# Patient Record
Sex: Female | Born: 1955 | Race: Black or African American | Hispanic: No | Marital: Married | Smoking: Former smoker
Health system: Southern US, Community
[De-identification: ages and names within clinical notes are randomized; demographics above are authoritative.]

## PROBLEM LIST (undated history)

## (undated) DIAGNOSIS — Z8489 Family history of other specified conditions: Secondary | ICD-10-CM

## (undated) DIAGNOSIS — N760 Acute vaginitis: Secondary | ICD-10-CM

## (undated) DIAGNOSIS — N926 Irregular menstruation, unspecified: Secondary | ICD-10-CM

## (undated) DIAGNOSIS — Z8719 Personal history of other diseases of the digestive system: Secondary | ICD-10-CM

## (undated) DIAGNOSIS — B9689 Other specified bacterial agents as the cause of diseases classified elsewhere: Secondary | ICD-10-CM

## (undated) DIAGNOSIS — N871 Moderate cervical dysplasia: Secondary | ICD-10-CM

## (undated) DIAGNOSIS — G4730 Sleep apnea, unspecified: Secondary | ICD-10-CM

## (undated) DIAGNOSIS — N951 Menopausal and female climacteric states: Secondary | ICD-10-CM

## (undated) DIAGNOSIS — Z87442 Personal history of urinary calculi: Secondary | ICD-10-CM

## (undated) DIAGNOSIS — Z8639 Personal history of other endocrine, nutritional and metabolic disease: Secondary | ICD-10-CM

## (undated) DIAGNOSIS — A599 Trichomoniasis, unspecified: Secondary | ICD-10-CM

## (undated) DIAGNOSIS — N750 Cyst of Bartholin's gland: Secondary | ICD-10-CM

## (undated) DIAGNOSIS — I10 Essential (primary) hypertension: Secondary | ICD-10-CM

## (undated) DIAGNOSIS — D219 Benign neoplasm of connective and other soft tissue, unspecified: Secondary | ICD-10-CM

## (undated) HISTORY — DX: Moderate cervical dysplasia: N87.1

## (undated) HISTORY — DX: Other specified bacterial agents as the cause of diseases classified elsewhere: B96.89

## (undated) HISTORY — DX: Cyst of Bartholin's gland: N75.0

## (undated) HISTORY — DX: Personal history of other diseases of the digestive system: Z87.19

## (undated) HISTORY — DX: Trichomoniasis, unspecified: A59.9

## (undated) HISTORY — DX: Personal history of other endocrine, nutritional and metabolic disease: Z86.39

## (undated) HISTORY — DX: Menopausal and female climacteric states: N95.1

## (undated) HISTORY — PX: OTHER SURGICAL HISTORY: SHX169

## (undated) HISTORY — DX: Benign neoplasm of connective and other soft tissue, unspecified: D21.9

## (undated) HISTORY — DX: Irregular menstruation, unspecified: N92.6

## (undated) HISTORY — DX: Acute vaginitis: N76.0

---

## 1991-01-29 HISTORY — PX: OTHER SURGICAL HISTORY: SHX169

## 1997-01-24 DIAGNOSIS — N871 Moderate cervical dysplasia: Secondary | ICD-10-CM

## 1997-01-24 DIAGNOSIS — A599 Trichomoniasis, unspecified: Secondary | ICD-10-CM

## 1997-01-24 HISTORY — DX: Trichomoniasis, unspecified: A59.9

## 1997-01-24 HISTORY — DX: Moderate cervical dysplasia: N87.1

## 1998-01-09 ENCOUNTER — Other Ambulatory Visit: Admission: RE | Admit: 1998-01-09 | Discharge: 1998-01-09 | Payer: Self-pay | Admitting: Obstetrics and Gynecology

## 1999-06-12 ENCOUNTER — Other Ambulatory Visit: Admission: RE | Admit: 1999-06-12 | Discharge: 1999-06-12 | Payer: Self-pay | Admitting: Obstetrics and Gynecology

## 2000-03-14 ENCOUNTER — Encounter: Payer: Self-pay | Admitting: Family Medicine

## 2000-03-14 ENCOUNTER — Encounter: Admission: RE | Admit: 2000-03-14 | Discharge: 2000-03-14 | Payer: Self-pay | Admitting: Family Medicine

## 2000-08-25 ENCOUNTER — Other Ambulatory Visit: Admission: RE | Admit: 2000-08-25 | Discharge: 2000-08-25 | Payer: Self-pay | Admitting: Obstetrics and Gynecology

## 2000-08-26 ENCOUNTER — Other Ambulatory Visit: Admission: RE | Admit: 2000-08-26 | Discharge: 2000-08-26 | Payer: Self-pay | Admitting: Obstetrics and Gynecology

## 2000-08-26 ENCOUNTER — Encounter (INDEPENDENT_AMBULATORY_CARE_PROVIDER_SITE_OTHER): Payer: Self-pay | Admitting: Specialist

## 2001-04-28 ENCOUNTER — Encounter: Payer: Self-pay | Admitting: Family Medicine

## 2001-04-28 ENCOUNTER — Encounter: Admission: RE | Admit: 2001-04-28 | Discharge: 2001-04-28 | Payer: Self-pay | Admitting: Family Medicine

## 2001-08-27 ENCOUNTER — Other Ambulatory Visit: Admission: RE | Admit: 2001-08-27 | Discharge: 2001-08-27 | Payer: Self-pay | Admitting: Obstetrics and Gynecology

## 2001-08-27 DIAGNOSIS — B9689 Other specified bacterial agents as the cause of diseases classified elsewhere: Secondary | ICD-10-CM

## 2001-08-27 HISTORY — DX: Other specified bacterial agents as the cause of diseases classified elsewhere: B96.89

## 2001-09-02 ENCOUNTER — Encounter: Payer: Self-pay | Admitting: Obstetrics and Gynecology

## 2001-09-02 ENCOUNTER — Encounter: Admission: RE | Admit: 2001-09-02 | Discharge: 2001-09-02 | Payer: Self-pay | Admitting: Obstetrics and Gynecology

## 2002-06-14 ENCOUNTER — Encounter: Admission: RE | Admit: 2002-06-14 | Discharge: 2002-06-14 | Payer: Self-pay | Admitting: Obstetrics and Gynecology

## 2002-06-14 ENCOUNTER — Encounter: Payer: Self-pay | Admitting: Obstetrics and Gynecology

## 2002-06-18 ENCOUNTER — Encounter: Payer: Self-pay | Admitting: Obstetrics and Gynecology

## 2002-06-18 ENCOUNTER — Encounter: Admission: RE | Admit: 2002-06-18 | Discharge: 2002-06-18 | Payer: Self-pay | Admitting: Obstetrics and Gynecology

## 2002-08-24 ENCOUNTER — Other Ambulatory Visit: Admission: RE | Admit: 2002-08-24 | Discharge: 2002-08-24 | Payer: Self-pay | Admitting: Obstetrics and Gynecology

## 2003-09-06 ENCOUNTER — Other Ambulatory Visit: Admission: RE | Admit: 2003-09-06 | Discharge: 2003-09-06 | Payer: Self-pay | Admitting: Obstetrics and Gynecology

## 2003-09-20 ENCOUNTER — Encounter: Admission: RE | Admit: 2003-09-20 | Discharge: 2003-09-20 | Payer: Self-pay | Admitting: Family Medicine

## 2004-10-01 ENCOUNTER — Other Ambulatory Visit: Admission: RE | Admit: 2004-10-01 | Discharge: 2004-10-01 | Payer: Self-pay | Admitting: Obstetrics and Gynecology

## 2004-11-15 ENCOUNTER — Encounter: Admission: RE | Admit: 2004-11-15 | Discharge: 2004-11-15 | Payer: Self-pay | Admitting: Family Medicine

## 2005-10-09 ENCOUNTER — Other Ambulatory Visit: Admission: RE | Admit: 2005-10-09 | Discharge: 2005-10-09 | Payer: Self-pay | Admitting: Obstetrics and Gynecology

## 2005-12-05 ENCOUNTER — Encounter: Admission: RE | Admit: 2005-12-05 | Discharge: 2005-12-05 | Payer: Self-pay | Admitting: Family Medicine

## 2006-12-18 ENCOUNTER — Encounter: Admission: RE | Admit: 2006-12-18 | Discharge: 2006-12-18 | Payer: Self-pay | Admitting: Family Medicine

## 2007-07-30 ENCOUNTER — Encounter: Admission: RE | Admit: 2007-07-30 | Discharge: 2007-07-30 | Payer: Self-pay | Admitting: Family Medicine

## 2008-01-13 ENCOUNTER — Encounter: Admission: RE | Admit: 2008-01-13 | Discharge: 2008-01-13 | Payer: Self-pay | Admitting: Family Medicine

## 2009-01-24 ENCOUNTER — Encounter: Admission: RE | Admit: 2009-01-24 | Discharge: 2009-01-24 | Payer: Self-pay | Admitting: Family Medicine

## 2009-10-23 ENCOUNTER — Encounter: Admission: RE | Admit: 2009-10-23 | Discharge: 2009-10-23 | Payer: Self-pay | Admitting: Family Medicine

## 2010-01-19 DIAGNOSIS — Z8639 Personal history of other endocrine, nutritional and metabolic disease: Secondary | ICD-10-CM

## 2010-01-19 HISTORY — DX: Personal history of other endocrine, nutritional and metabolic disease: Z86.39

## 2010-01-30 ENCOUNTER — Encounter: Admission: RE | Admit: 2010-01-30 | Discharge: 2010-01-30 | Payer: Self-pay | Admitting: Family Medicine

## 2010-01-30 IMAGING — MG MM DIGITAL SCREENING
4 series · 4 of 4 positions shown · non-contrast
Comparison: Prior studies.

DG SCREEN MAMMOGRAM BILATERAL
Bilateral CC and MLO view(s) were taken.

DIGITAL SCREENING MAMMOGRAM WITH CAD:

[R CC]
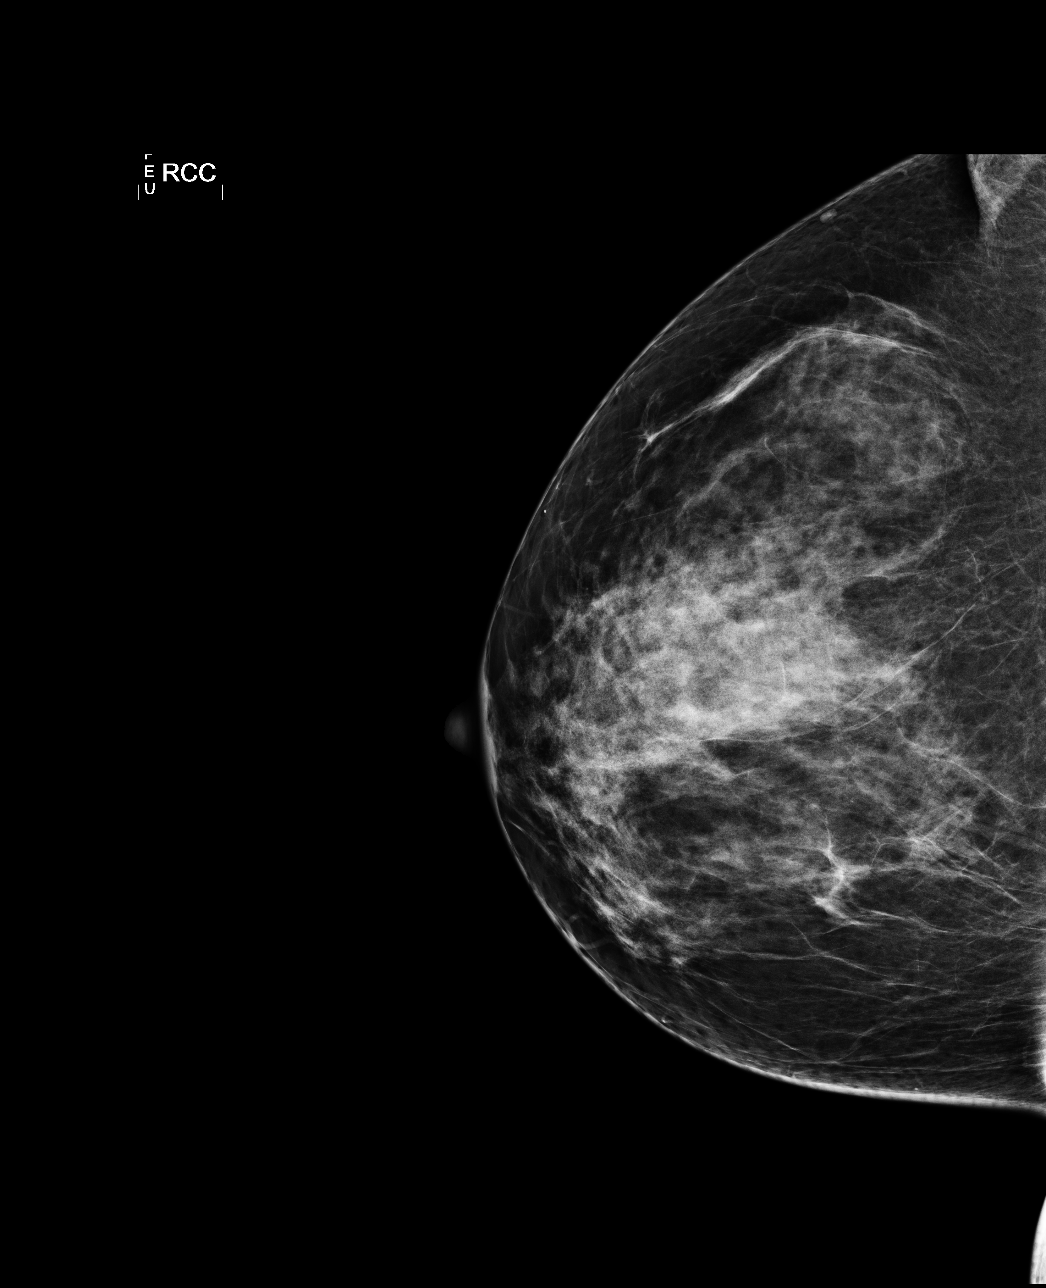

[L CC]
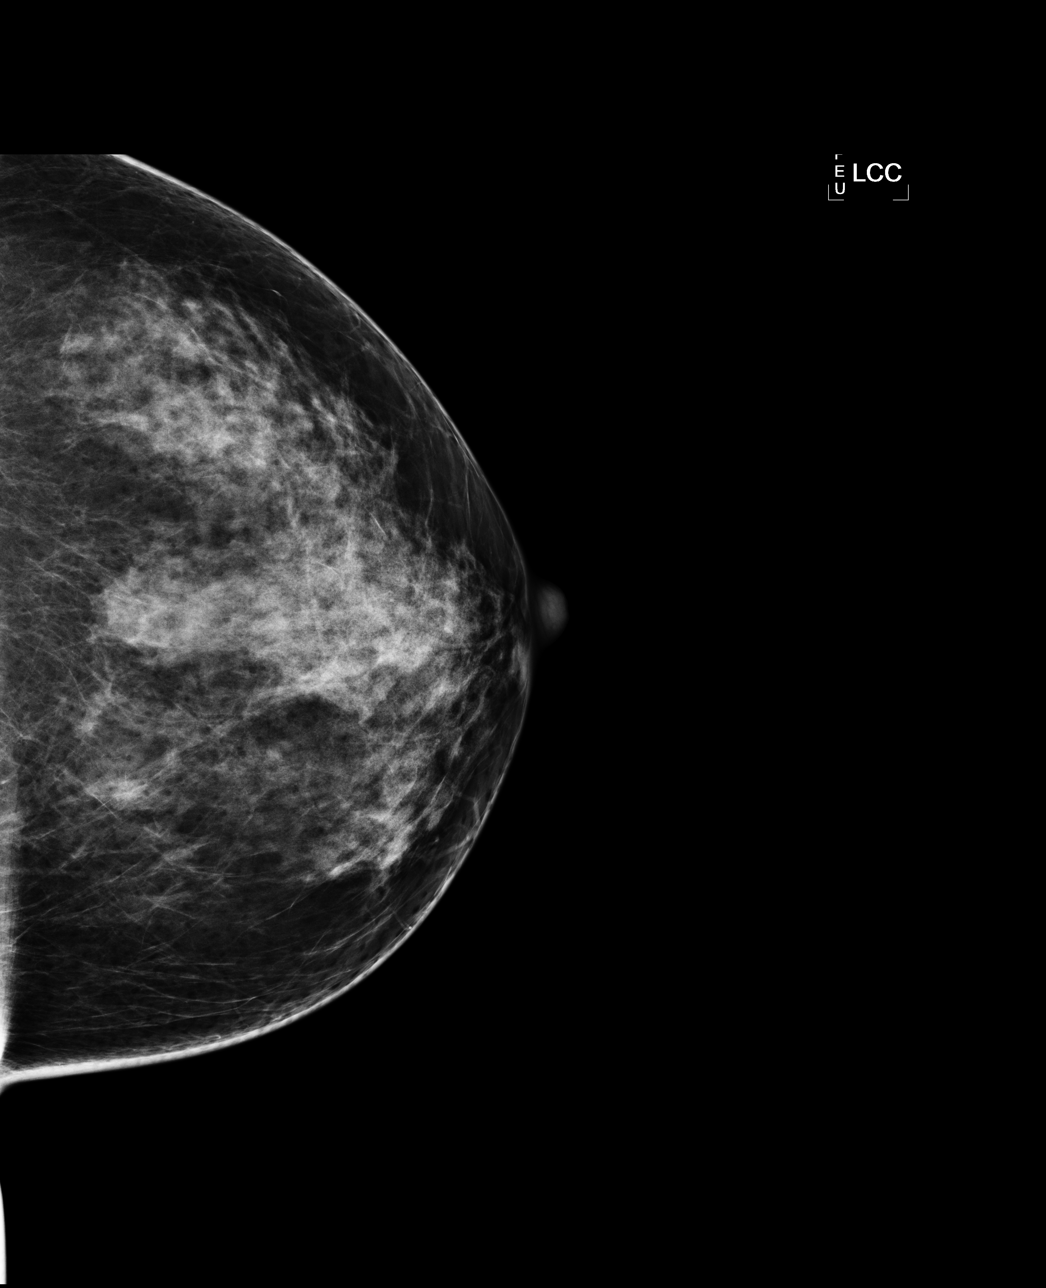

[L MLO]
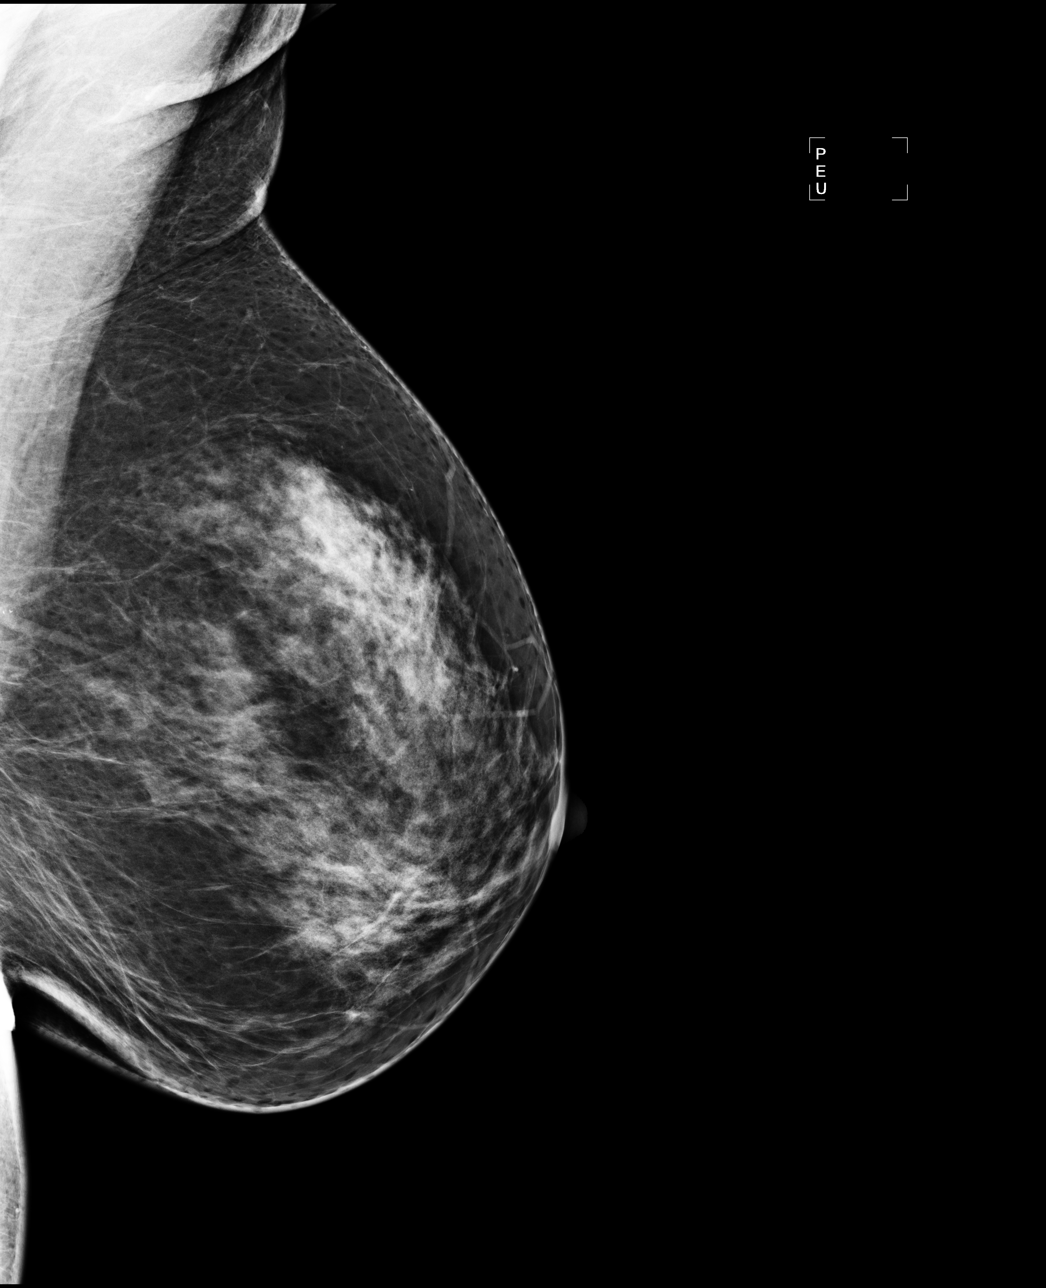

[R MLO]
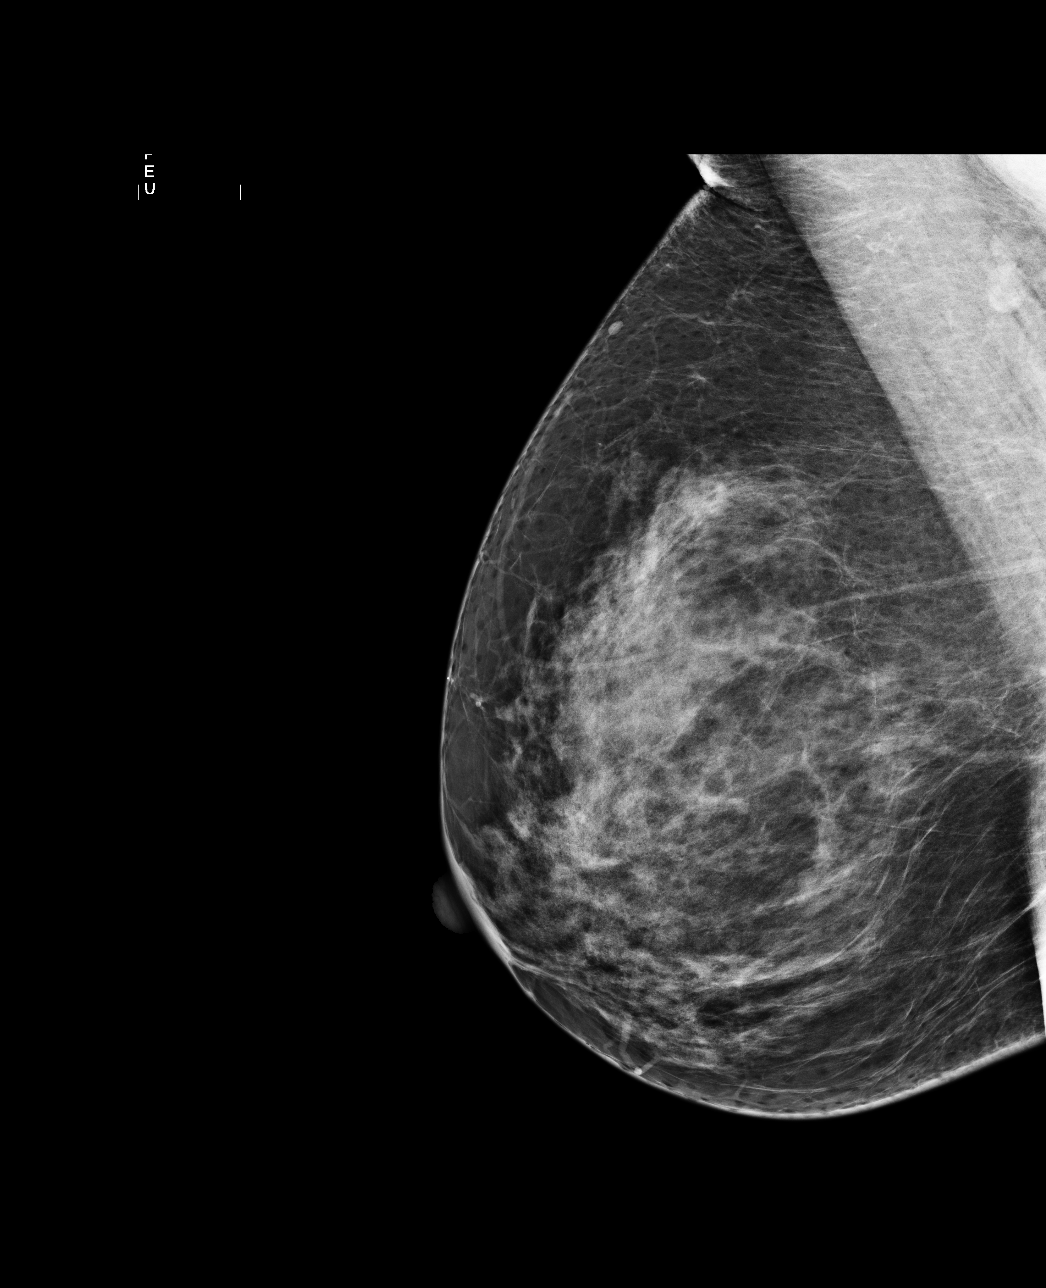

[4 of 4 positions shown; findings below may reference images not displayed]

The breast tissue is heterogeneously dense.  There is no dominant mass, architectural distortion or
calcification to suggest malignancy.

Images were processed with CAD.
IMPRESSION: No mammographic evidence of malignancy.  Suggest yearly screening mammography.

A result letter of this screening mammogram will be mailed directly to the patient.

ASSESSMENT: Negative - BI-RADS 1

Screening mammogram in 1 year.
,

## 2011-01-24 ENCOUNTER — Other Ambulatory Visit: Payer: Self-pay | Admitting: Family Medicine

## 2011-01-24 DIAGNOSIS — Z1231 Encounter for screening mammogram for malignant neoplasm of breast: Secondary | ICD-10-CM

## 2011-02-01 ENCOUNTER — Ambulatory Visit
Admission: RE | Admit: 2011-02-01 | Discharge: 2011-02-01 | Disposition: A | Discharge status: Home / Self Care | Payer: BC Managed Care – PPO | Source: Ambulatory Visit | Attending: Family Medicine | Admitting: Family Medicine

## 2011-02-01 DIAGNOSIS — Z1231 Encounter for screening mammogram for malignant neoplasm of breast: Secondary | ICD-10-CM

## 2012-01-15 ENCOUNTER — Other Ambulatory Visit: Payer: Self-pay | Admitting: Family Medicine

## 2012-01-15 DIAGNOSIS — Z1231 Encounter for screening mammogram for malignant neoplasm of breast: Secondary | ICD-10-CM

## 2012-01-28 ENCOUNTER — Ambulatory Visit (INDEPENDENT_AMBULATORY_CARE_PROVIDER_SITE_OTHER): Payer: BC Managed Care – PPO | Admitting: Obstetrics and Gynecology

## 2012-01-28 ENCOUNTER — Encounter: Payer: Self-pay | Admitting: Obstetrics and Gynecology

## 2012-01-28 VITALS — BP 118/68 | Temp 98.2°F | Resp 16 | Ht 64.0 in | Wt 173.0 lb

## 2012-01-28 DIAGNOSIS — Z01419 Encounter for gynecological examination (general) (routine) without abnormal findings: Secondary | ICD-10-CM

## 2012-01-28 DIAGNOSIS — K649 Unspecified hemorrhoids: Secondary | ICD-10-CM

## 2012-01-28 DIAGNOSIS — D219 Benign neoplasm of connective and other soft tissue, unspecified: Secondary | ICD-10-CM | POA: Insufficient documentation

## 2012-01-28 DIAGNOSIS — Z124 Encounter for screening for malignant neoplasm of cervix: Secondary | ICD-10-CM

## 2012-01-28 DIAGNOSIS — E7800 Pure hypercholesterolemia, unspecified: Secondary | ICD-10-CM

## 2012-01-28 DIAGNOSIS — D259 Leiomyoma of uterus, unspecified: Secondary | ICD-10-CM

## 2012-01-28 DIAGNOSIS — E669 Obesity, unspecified: Secondary | ICD-10-CM

## 2012-01-28 NOTE — Progress Notes (Signed)
Regular Periods: no Mammogram: 01/2011 per pt Due Monday   Monthly Breast Ex.: yes Exercise: yes  Tetanus < 10 years: yes Seatbelts: yes  NI. Bladder Functn.: yes Abuse at home: no  Daily BM's: yes Stressful Work: no  Healthy Diet: yes Sigmoid-Colonoscopy: per pt 2008  Calcium: yes Medical problems this year: per pt none.   LAST PAP: 01/2009 "WNL"  Contraception: per pt none  Mammogram:  Due Monday 02-03-2012  PCP: Dr.Donna Kevan Ny  PMH: No Changes  FMH: No Changes  Last Bone Scan: per pt  "2010" "WNL"   Subjective:    Heather Wilkerson is a 56 y.o. female No obstetric history on file. who presents for annual exam. The patient has no complaints today. She has a known history of hemorrhoids but is doing well.  She has occasional bleeding only.  The patient has known fibroids but this is not a problem for her.  The following portions of the patient's history were reviewed and updated as appropriate: allergies, current medications, past family history, past medical history, past social history, past surgical history and problem list. See above.  Review of Systems Pertinent items are noted in HPI. Gastrointestinal:No change in bowel habits, no abdominal pain, no rectal bleeding Genitourinary:negative for dysuria, frequency, hematuria, nocturia and urinary incontinence    Objective:     BP 118/68  Temp 98.2 F (36.8 C)  Resp 16  Ht 5\' 4"  (1.626 m)  Wt 173 lb (78.472 kg)  BMI 29.70 kg/m2  Weight:  Wt Readings from Last 1 Encounters:  01/28/12 173 lb (78.472 kg)     BMI: Body mass index is 29.70 kg/(m^2). General Appearance: Alert, appropriate appearance for age. No acute distress HEENT: Grossly normal Neck / Thyroid: Supple, no masses, nodes or enlargement Lungs: clear to auscultation bilaterally Back: No CVA tenderness Breast Exam: No masses or nodes.No dimpling, nipple retraction or discharge. Cardiovascular: Regular rate and rhythm. S1, S2, no murmur Gastrointestinal:  Soft, non-tender, no masses or organomegaly  ++++++++++++++++++++++++++++++++++++++++++++++++++++++++  Pelvic Exam: External genitalia: normal general appearance Vaginal: normal without tenderness, induration or masses and atrophic mucosa Cervix: normal appearance Adnexa: normal bimanual exam Uterus: upper limits normal size, irregular Rectovaginal: normal rectal, no masses and hemorrhoids: external non-thrombosed  ++++++++++++++++++++++++++++++++++++++++++++++++++++++++  Lymphatic Exam: Non-palpable nodes in neck, clavicular, axillary, or inguinal regions  Psychiatric: Alert and oriented, appropriate affect.    Urinalysis:Not done      Assessment:    Normal gyn exam   Fibroid uterus  Hemorrhoids  Hypercholesterolemia  Atrophic vaginal changes  Overweight or obese: Yes  Pelvic relaxation: No  Menopausal symptoms: No. Severe: No.   Plan:    Mammogram. Pap smear.   Follow-up:  for annual exam  STD screen request: none,  RPR: No,  HBsAg: No.  Hepatitis C: No.  The updated Pap smear screening guidelines were discussed with the patient. The patient requested that I obtain a Pap smear: Yes.  Kegel exercises discussed: No.  Proper diet and regular exercise were reviewed.  Annual mammograms recommended starting at age 86. Proper breast care was discussed.  Screening colonoscopy is recommended beginning at age 106.  Regular health maintenance was reviewed.  Sleep hygiene was discussed.  Adequate calcium and vitamin D intake was emphasized.  Mylinda Latina.D.

## 2012-01-29 LAB — PAP IG W/ RFLX HPV ASCU

## 2012-02-03 ENCOUNTER — Ambulatory Visit
Admission: RE | Admit: 2012-02-03 | Discharge: 2012-02-03 | Disposition: A | Discharge status: Home / Self Care | Payer: BC Managed Care – PPO | Source: Ambulatory Visit | Attending: Family Medicine | Admitting: Family Medicine

## 2012-02-03 DIAGNOSIS — Z1231 Encounter for screening mammogram for malignant neoplasm of breast: Secondary | ICD-10-CM

## 2013-01-29 ENCOUNTER — Other Ambulatory Visit: Payer: Self-pay

## 2013-01-29 DIAGNOSIS — Z1231 Encounter for screening mammogram for malignant neoplasm of breast: Secondary | ICD-10-CM

## 2013-02-08 ENCOUNTER — Ambulatory Visit
Admission: RE | Admit: 2013-02-08 | Discharge: 2013-02-08 | Disposition: A | Discharge status: Home / Self Care | Payer: BC Managed Care – PPO | Source: Ambulatory Visit

## 2013-02-08 DIAGNOSIS — Z1231 Encounter for screening mammogram for malignant neoplasm of breast: Secondary | ICD-10-CM

## 2013-05-11 ENCOUNTER — Other Ambulatory Visit: Payer: Self-pay | Admitting: Family Medicine

## 2013-05-11 DIAGNOSIS — M899 Disorder of bone, unspecified: Secondary | ICD-10-CM

## 2013-06-18 ENCOUNTER — Other Ambulatory Visit: Payer: BC Managed Care – PPO

## 2013-06-28 ENCOUNTER — Other Ambulatory Visit: Payer: BC Managed Care – PPO

## 2013-07-27 ENCOUNTER — Ambulatory Visit
Admission: RE | Admit: 2013-07-27 | Discharge: 2013-07-27 | Disposition: A | Discharge status: Home / Self Care | Payer: BC Managed Care – PPO | Source: Ambulatory Visit | Attending: Family Medicine | Admitting: Family Medicine

## 2013-07-27 DIAGNOSIS — M899 Disorder of bone, unspecified: Secondary | ICD-10-CM

## 2014-01-31 ENCOUNTER — Other Ambulatory Visit: Payer: Self-pay

## 2014-01-31 DIAGNOSIS — Z1231 Encounter for screening mammogram for malignant neoplasm of breast: Secondary | ICD-10-CM

## 2014-02-17 ENCOUNTER — Ambulatory Visit
Admission: RE | Admit: 2014-02-17 | Discharge: 2014-02-17 | Disposition: A | Discharge status: Home / Self Care | Payer: BC Managed Care – PPO | Source: Ambulatory Visit

## 2014-02-17 ENCOUNTER — Encounter (INDEPENDENT_AMBULATORY_CARE_PROVIDER_SITE_OTHER): Payer: Self-pay

## 2014-02-17 DIAGNOSIS — Z1231 Encounter for screening mammogram for malignant neoplasm of breast: Secondary | ICD-10-CM

## 2015-01-23 ENCOUNTER — Other Ambulatory Visit: Payer: Self-pay

## 2015-01-23 DIAGNOSIS — Z803 Family history of malignant neoplasm of breast: Secondary | ICD-10-CM

## 2015-01-23 DIAGNOSIS — Z1231 Encounter for screening mammogram for malignant neoplasm of breast: Secondary | ICD-10-CM

## 2015-02-20 ENCOUNTER — Ambulatory Visit
Admission: RE | Admit: 2015-02-20 | Discharge: 2015-02-20 | Disposition: A | Discharge status: Home / Self Care | Payer: BC Managed Care – PPO | Source: Ambulatory Visit

## 2015-02-20 DIAGNOSIS — Z1231 Encounter for screening mammogram for malignant neoplasm of breast: Secondary | ICD-10-CM

## 2015-02-20 DIAGNOSIS — Z803 Family history of malignant neoplasm of breast: Secondary | ICD-10-CM

## 2016-07-03 ENCOUNTER — Other Ambulatory Visit: Payer: Self-pay | Admitting: Family Medicine

## 2016-07-03 DIAGNOSIS — M8580 Other specified disorders of bone density and structure, unspecified site: Secondary | ICD-10-CM

## 2016-07-17 ENCOUNTER — Other Ambulatory Visit: Payer: BC Managed Care – PPO

## 2016-07-24 ENCOUNTER — Ambulatory Visit
Admission: RE | Admit: 2016-07-24 | Discharge: 2016-07-24 | Disposition: A | Discharge status: Home / Self Care | Payer: BC Managed Care – PPO | Source: Ambulatory Visit | Attending: Family Medicine | Admitting: Family Medicine

## 2016-07-24 DIAGNOSIS — M8580 Other specified disorders of bone density and structure, unspecified site: Secondary | ICD-10-CM

## 2019-06-21 ENCOUNTER — Other Ambulatory Visit: Payer: Self-pay | Admitting: *Deleted

## 2019-06-21 DIAGNOSIS — Z20822 Contact with and (suspected) exposure to covid-19: Secondary | ICD-10-CM

## 2019-06-22 LAB — NOVEL CORONAVIRUS, NAA: SARS-CoV-2, NAA: NOT DETECTED

## 2019-09-15 ENCOUNTER — Other Ambulatory Visit: Payer: Self-pay | Admitting: Family Medicine

## 2019-09-15 DIAGNOSIS — M8580 Other specified disorders of bone density and structure, unspecified site: Secondary | ICD-10-CM

## 2019-12-03 ENCOUNTER — Other Ambulatory Visit: Payer: Self-pay

## 2019-12-03 ENCOUNTER — Ambulatory Visit
Admission: RE | Admit: 2019-12-03 | Discharge: 2019-12-03 | Disposition: A | Discharge status: Home / Self Care | Payer: BC Managed Care – PPO | Source: Ambulatory Visit | Attending: Family Medicine | Admitting: Family Medicine

## 2019-12-03 DIAGNOSIS — M8580 Other specified disorders of bone density and structure, unspecified site: Secondary | ICD-10-CM

## 2020-05-06 ENCOUNTER — Emergency Department (HOSPITAL_COMMUNITY)
Admission: EM | Admit: 2020-05-06 | Discharge: 2020-05-07 | Disposition: A | Discharge status: Home / Self Care | Payer: Medicare PPO | Attending: Emergency Medicine | Admitting: Emergency Medicine

## 2020-05-06 ENCOUNTER — Encounter (HOSPITAL_COMMUNITY): Payer: Self-pay

## 2020-05-06 DIAGNOSIS — N200 Calculus of kidney: Secondary | ICD-10-CM | POA: Diagnosis not present

## 2020-05-06 DIAGNOSIS — R739 Hyperglycemia, unspecified: Secondary | ICD-10-CM

## 2020-05-06 DIAGNOSIS — R7309 Other abnormal glucose: Secondary | ICD-10-CM | POA: Insufficient documentation

## 2020-05-06 DIAGNOSIS — Z87891 Personal history of nicotine dependence: Secondary | ICD-10-CM | POA: Diagnosis not present

## 2020-05-06 DIAGNOSIS — N201 Calculus of ureter: Secondary | ICD-10-CM | POA: Diagnosis not present

## 2020-05-06 DIAGNOSIS — R110 Nausea: Secondary | ICD-10-CM | POA: Insufficient documentation

## 2020-05-06 DIAGNOSIS — R109 Unspecified abdominal pain: Secondary | ICD-10-CM | POA: Diagnosis present

## 2020-05-06 LAB — URINALYSIS, ROUTINE W REFLEX MICROSCOPIC
Bacteria, UA: NONE SEEN
Bilirubin Urine: NEGATIVE
Glucose, UA: NEGATIVE mg/dL
Ketones, ur: NEGATIVE mg/dL
Nitrite: NEGATIVE
Protein, ur: 30 mg/dL — AB
RBC / HPF: >50 RBC/hpf — ABNORMAL HIGH (ref 0–5)
Specific Gravity, Urine: 1.02 (ref 1.005–1.030)
pH: 5 (ref 5.0–8.0)

## 2020-05-06 LAB — COMPREHENSIVE METABOLIC PANEL
ALT: 24 U/L (ref 0–44)
AST: 26 U/L (ref 15–41)
Albumin: 3.8 g/dL (ref 3.5–5.0)
Alkaline Phosphatase: 62 U/L (ref 38–126)
Anion gap: 10 (ref 5–15)
BUN: 19 mg/dL (ref 8–23)
CO2: 23 mmol/L (ref 22–32)
Calcium: 9.7 mg/dL (ref 8.9–10.3)
Chloride: 108 mmol/L (ref 98–111)
Creatinine, Ser: 0.92 mg/dL (ref 0.44–1.00)
GFR calc Af Amer: >60 mL/min (ref >60)
GFR calc non Af Amer: >60 mL/min (ref >60)
Glucose, Bld: 145 mg/dL — ABNORMAL HIGH (ref 70–99)
Potassium: 3.9 mmol/L (ref 3.5–5.1)
Sodium: 141 mmol/L (ref 135–145)
Total Bilirubin: 0.5 mg/dL (ref 0.3–1.2)
Total Protein: 6.8 g/dL (ref 6.5–8.1)

## 2020-05-06 LAB — CBC
HCT: 40.2 % (ref 36.0–46.0)
Hemoglobin: 12.8 g/dL (ref 12.0–15.0)
MCH: 27.4 pg (ref 26.0–34.0)
MCHC: 31.8 g/dL (ref 30.0–36.0)
MCV: 85.9 fL (ref 80.0–100.0)
Platelets: 283 10*3/uL (ref 150–400)
RBC: 4.68 MIL/uL (ref 3.87–5.11)
RDW: 13.6 % (ref 11.5–15.5)
WBC: 11.9 10*3/uL — ABNORMAL HIGH (ref 4.0–10.5)
nRBC: 0 % (ref 0.0–0.2)

## 2020-05-06 LAB — LIPASE, BLOOD: Lipase: 35 U/L (ref 11–51)

## 2020-05-06 NOTE — ED Triage Notes (Signed)
Pt c/o is Left Flank pain. Pt states she has had blood in her urine that she noticed tonight. Pt is complaining of pain and nausea. Pt reports blood in her stool as well. Pt denies hx of kidney stones.

## 2020-05-07 ENCOUNTER — Emergency Department (HOSPITAL_COMMUNITY): Payer: Medicare PPO

## 2020-05-07 ENCOUNTER — Other Ambulatory Visit: Payer: Self-pay

## 2020-05-07 MED ORDER — TAMSULOSIN HCL 0.4 MG PO CAPS: 0.4000 mg | ORAL_CAPSULE | Freq: Every day | ORAL | 0 refills | Status: DC

## 2020-05-07 MED ORDER — HYDROCODONE-ACETAMINOPHEN 5-325 MG PO TABS: 1.0000 | ORAL_TABLET | ORAL | 0 refills | Status: DC | PRN

## 2020-05-07 MED ORDER — TAMSULOSIN HCL 0.4 MG PO CAPS
0.4000 mg | ORAL_CAPSULE | Freq: Once | ORAL | Status: AC
  Administered 2020-05-07: 0.4 mg via ORAL
  Filled 2020-05-07: qty 1

## 2020-05-07 NOTE — ED Notes (Signed)
Patient transported to CT 

## 2020-05-07 NOTE — Discharge Instructions (Signed)
Drink plenty of fluids.  Return if pain is not being adequately controlled, or if you start running a fever.  Your blood sugar was slightly elevated today.  Please have that rechecked by your primary care provider.

## 2020-05-07 NOTE — ED Provider Notes (Signed)
[facility] EMERGENCY DEPARTMENT Provider Note   CSN: [id number] Arrival date & time: 05/06/20  2052   History Chief Complaint  Patient presents with   Flank Pain   Hematuria    Heather Wilkerson is a 64 y.o. female.  The history is provided by the patient.  Flank Pain  Hematuria  She has history of hyperlipidemia and comes in because of left flank pain.  She had sudden onset about 8 PM of severe pain in the left flank without radiation.  There is associated nausea and a sense of rectal urgency.  When she urinated, she noted that there was blood in the urine.  Nothing made the pain better, nothing made it worse.  It lasted about an hour before subsiding.  She still is having some intermittent light twinges of pain in the same area.  She has never had anything like this before.  She denies fever, chills, sweats.  She denies any urinary difficulty.  There was no treatment at home.  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     No family history on file.  Social History   Tobacco Use   Smoking status: Former Smoker    Quit date: 01/28/1996    Years since quitting: 24.2   Smokeless tobacco: Never Used  Substance Use Topics   Alcohol use: No   Drug use: No    Home Medications Prior to  Admission medications   Medication Sig Start Date End Date Taking? Authorizing Provider  atorvastatin (LIPITOR) 10 MG tablet Take 10 mg by mouth daily.    [provider]  calcium carbonate (OS-CAL) 600 MG TABS Take 600 mg by mouth 2 (two) times daily with a meal.    [provider]  Cholecalciferol (VITAMIN D) 2000 UNITS tablet Take 2,000 Units by mouth daily.    [provider]  fish oil-omega-3 fatty acids 1000 MG capsule Take 2 g by mouth daily.    [provider]  Multiple Vitamin (MULTIVITAMIN) capsule Take 1 capsule by mouth daily.    [provider]    Allergies    Patient has no known allergies.  Review of Systems   Review of Systems  Genitourinary: Positive for flank pain and hematuria.  All other systems reviewed and are negative.   Physical Exam Updated Vital Signs BP 130/85    Pulse 66    Temp 97.9 F (36.6 C) (Oral)    Resp 17    Ht 5\' 4"  (1.626 m)    Wt 77.1 kg    SpO2 100%    BMI 29.18 kg/m   Physical Exam Vitals and nursing note reviewed.   64 year old female, resting comfortably and in no acute distress. Vital signs are  normal. Oxygen saturation is 100%, which is normal. Head is normocephalic and atraumatic. PERRLA, EOMI. Oropharynx is clear. Neck is nontender and supple without adenopathy or JVD. Back is nontender and there is no CVA tenderness. Lungs are clear without rales, wheezes, or rhonchi. Chest is nontender. Heart has regular rate and rhythm without murmur. Abdomen is soft, flat, nontender without masses or hepatosplenomegaly and peristalsis is normoactive. Extremities have no cyanosis or edema, full range of motion is present. Skin is warm and dry without rash. Neurologic: Mental status is normal, cranial nerves are intact, there are no motor or sensory deficits.  ED Results / Procedures / Treatments   Labs (all labs ordered are listed, but only abnormal results are displayed) Labs Reviewed    COMPREHENSIVE METABOLIC PANEL - Abnormal; Notable for the following components:      Result Value   Glucose, Bld 145 (*)    All other components within normal limits  CBC - Abnormal; Notable for the following components:   WBC 11.9 (*)    All other components within normal limits  URINALYSIS, ROUTINE W REFLEX MICROSCOPIC - Abnormal; Notable for the following components:   APPearance HAZY (*)    Hgb urine dipstick LARGE (*)    Protein, ur 30 (*)    Leukocytes,Ua TRACE (*)    RBC / HPF >50 (*)    All other components within normal limits  LIPASE, BLOOD   Radiology CT Renal Stone Study  Result Date: 05/07/2020 CLINICAL DATA:  Initial evaluation for acute flank pain, stone disease suspected. EXAM: CT ABDOMEN AND PELVIS WITHOUT CONTRAST TECHNIQUE: Multidetector CT imaging of the abdomen and pelvis was performed following the standard protocol without IV contrast. COMPARISON:  None. FINDINGS: Lower chest: Visualized lung bases are clear. Hepatobiliary: Liver demonstrates a normal unenhanced appearance. Gallbladder within normal limits. No biliary dilatation. Pancreas: Pancreas within normal limits. Spleen: Spleen mildly lobulated but otherwise unremarkable and within normal limits. Adrenals/Urinary Tract: 1.7 cm hypodense left adrenal nodule, most consistent with a benign adenoma. Adrenal glands otherwise unremarkable. Right kidney unremarkable without nephrolithiasis or hydronephrosis. No radiopaque calculi seen along the course of the right renal collecting system. No right-sided hydroureter. On the left there is an obstructive 4 mm calculus within the proximal left ureter just beyond the left UPJ (series 3, image 34). Associated mild left hydronephrosis and perinephric fat stranding. No other radiopaque calculi seen distally along the course of the left ureter. Additional 6 mm nonobstructive stone present at the upper pole. Partially distended bladder within normal limits. No layering stones within  the bladder lumen. Stomach/Bowel: Stomach within normal limits. No evidence for bowel obstruction. Normal appendix. No acute inflammatory changes seen about the bowels. Vascular/Lymphatic: Mild to moderate aorto bi-iliac atherosclerotic disease. No aneurysm. No adenopathy. Reproductive: Uterus and ovaries within normal limits. Other: No free air or fluid. Musculoskeletal: No acute osseous abnormality. No discrete or worrisome osseous lesions. Grade 1 anterolisthesis of L4 on L5, chronic and facet mediated. IMPRESSION: 1. 4 mm obstructive calculus within the proximal left ureter with associated mild left hydronephrosis. 2. Additional 6 mm nonobstructive left renal nephrolithiasis. 3. No other acute intra-abdominal or pelvic process. 4. 1.7 cm left adrenal adenoma. Aortic Atherosclerosis (ICD10-I70.0). Electronically Signed   By: Jeannine Boga M.D.   On: 05/07/2020 05:16    Procedures Procedures  Medications Ordered in ED Medications  tamsulosin (FLOMAX) capsule 0.4 mg (has no administration in time range)    ED Course  I have reviewed the triage vital signs  and the nursing notes.  Pertinent labs & imaging results that were available during my care of the patient were reviewed by me and considered in my medical decision making (see chart for details).  MDM Rules/Calculators/A&P Left flank pain and pattern strongly suggestive of urolithiasis and renal colic.  No risk factors for abdominal aortic aneurysm.  Symptoms not suggestive of UTI or pyelonephritis.  Urinalysis does show greater than 50 RBCs without evidence of infection.  WBC is mildly elevated which is nonspecific.  Metabolic panel is remarkable for mildly elevated glucose in the prediabetic range.  Old records are reviewed, and she has no relevant prior visits, no prior abdominal imaging.  Will send for renal stone protocol CT scan.  CT scan shows 4 mm calculus in the left mid ureter with mild hydronephrosis.  This is consistent with  her presentation.  Additionally, there is a 6 mm calculus in the left renal pelvis.  She is discharged with prescriptions for tamsulosin and hydrocodone-acetaminophen, refer to urology for follow-up.  Referred back to her primary care provider to monitor her glucose level.  Return precautions discussed.  Final Clinical Impression(s) / ED Diagnoses Final diagnoses:  Ureterolithiasis  Nephrolithiasis  Elevated random blood glucose level    Rx / DC Orders ED Discharge Orders         Ordered    tamsulosin (FLOMAX) 0.4 MG CAPS capsule  Daily        05/07/20 0533    HYDROcodone-acetaminophen (NORCO) 5-325 MG tablet  Every 4 hours PRN        05/07/20 0881           Delora Fuel, MD [birth year] [phone]

## 2020-05-23 ENCOUNTER — Ambulatory Visit: Payer: Medicare PPO | Attending: Internal Medicine

## 2020-05-23 DIAGNOSIS — Z23 Encounter for immunization: Secondary | ICD-10-CM

## 2020-05-23 NOTE — Progress Notes (Signed)
   Covid-19 Vaccination Clinic  Name:  ELOINA ERGLE    MRN: [id number] DOB: 04-15-1956  05/23/2020  Ms. Byrd-Koran was observed post Covid-19 immunization for 15 minutes without incident. She was provided with Vaccine Information Sheet and instruction to access the V-Safe system.   Ms. Ulloa was instructed to call 911 with any severe reactions post vaccine: Marland Kitchen Difficulty breathing  . Swelling of face and throat  . A fast heartbeat  . A bad rash all over body  . Dizziness and weakness

## 2020-06-16 ENCOUNTER — Other Ambulatory Visit: Payer: Self-pay | Admitting: Urology

## 2020-06-19 NOTE — Addendum Note (Signed)
Addended by: Janith Lima on: 06/19/2020 05:05 PM   Modules accepted: Orders

## 2020-07-17 ENCOUNTER — Other Ambulatory Visit: Payer: Self-pay

## 2020-07-17 ENCOUNTER — Encounter (HOSPITAL_BASED_OUTPATIENT_CLINIC_OR_DEPARTMENT_OTHER): Payer: Self-pay | Admitting: Urology

## 2020-07-17 NOTE — Progress Notes (Signed)
Spoke w/ via phone for pre-op interview---pt Lab needs dos----  I stat 8, ekg             Lab results------none COVID test ------07-20-2020 1050 Arrive at -------1000 am 07-23-2020 NPO after MN NO Solid Food.  Clear liquids from MN until---900 am then npo Medications to take morning of surgery -----none Diabetic medication -----n/a Patient Special Instructions ----- bring cpap mask and tubing and machine and leave in car Pre-Op special Istructions -----none Patient verbalized understanding of instructions that were given at this phone interview. Patient denies shortness of breath, chest pain, fever, cough at this phone interview.

## 2020-07-20 ENCOUNTER — Other Ambulatory Visit (HOSPITAL_COMMUNITY)
Admission: RE | Admit: 2020-07-20 | Discharge: 2020-07-20 | Disposition: A | Discharge status: Home / Self Care | Payer: Medicare PPO | Source: Ambulatory Visit | Attending: Urology | Admitting: Urology

## 2020-07-20 DIAGNOSIS — Z20822 Contact with and (suspected) exposure to covid-19: Secondary | ICD-10-CM | POA: Diagnosis not present

## 2020-07-20 DIAGNOSIS — Z01812 Encounter for preprocedural laboratory examination: Secondary | ICD-10-CM | POA: Insufficient documentation

## 2020-07-20 LAB — SARS CORONAVIRUS 2 (TAT 6-24 HRS): SARS Coronavirus 2: NEGATIVE

## 2020-07-24 ENCOUNTER — Encounter (HOSPITAL_BASED_OUTPATIENT_CLINIC_OR_DEPARTMENT_OTHER): Payer: Self-pay | Admitting: Urology

## 2020-07-24 ENCOUNTER — Ambulatory Visit (HOSPITAL_BASED_OUTPATIENT_CLINIC_OR_DEPARTMENT_OTHER)
Admission: RE | Admit: 2020-07-24 | Discharge: 2020-07-24 | Disposition: A | Discharge status: Home / Self Care | Payer: Medicare PPO | Attending: Urology | Admitting: Urology

## 2020-07-24 ENCOUNTER — Ambulatory Visit (HOSPITAL_BASED_OUTPATIENT_CLINIC_OR_DEPARTMENT_OTHER): Payer: Medicare PPO | Admitting: Anesthesiology

## 2020-07-24 ENCOUNTER — Encounter (HOSPITAL_BASED_OUTPATIENT_CLINIC_OR_DEPARTMENT_OTHER)
Admission: RE | Disposition: A | Discharge status: Home / Self Care | Payer: Self-pay | Source: Home / Self Care | Attending: Urology

## 2020-07-24 ENCOUNTER — Other Ambulatory Visit: Payer: Self-pay

## 2020-07-24 DIAGNOSIS — Z9989 Dependence on other enabling machines and devices: Secondary | ICD-10-CM | POA: Diagnosis not present

## 2020-07-24 DIAGNOSIS — N132 Hydronephrosis with renal and ureteral calculous obstruction: Secondary | ICD-10-CM | POA: Insufficient documentation

## 2020-07-24 DIAGNOSIS — G4730 Sleep apnea, unspecified: Secondary | ICD-10-CM | POA: Diagnosis not present

## 2020-07-24 DIAGNOSIS — N202 Calculus of kidney with calculus of ureter: Secondary | ICD-10-CM

## 2020-07-24 DIAGNOSIS — Z87891 Personal history of nicotine dependence: Secondary | ICD-10-CM | POA: Diagnosis not present

## 2020-07-24 DIAGNOSIS — I10 Essential (primary) hypertension: Secondary | ICD-10-CM | POA: Diagnosis not present

## 2020-07-24 DIAGNOSIS — D3502 Benign neoplasm of left adrenal gland: Secondary | ICD-10-CM | POA: Insufficient documentation

## 2020-07-24 DIAGNOSIS — E7800 Pure hypercholesterolemia, unspecified: Secondary | ICD-10-CM | POA: Diagnosis not present

## 2020-07-24 HISTORY — DX: Sleep apnea, unspecified: G47.30

## 2020-07-24 HISTORY — DX: Family history of other specified conditions: Z84.89

## 2020-07-24 HISTORY — DX: Essential (primary) hypertension: I10

## 2020-07-24 HISTORY — DX: Personal history of urinary calculi: Z87.442

## 2020-07-24 HISTORY — PX: CYSTOSCOPY/URETEROSCOPY/HOLMIUM LASER/STENT PLACEMENT: SHX6546

## 2020-07-24 LAB — POCT I-STAT, CHEM 8
BUN: 16 mg/dL (ref 8–23)
Calcium, Ion: 1.2 mmol/L (ref 1.15–1.40)
Chloride: 107 mmol/L (ref 98–111)
Creatinine, Ser: 0.6 mg/dL (ref 0.44–1.00)
Glucose, Bld: 96 mg/dL (ref 70–99)
HCT: 38 % (ref 36.0–46.0)
Hemoglobin: 12.9 g/dL (ref 12.0–15.0)
Potassium: 3.9 mmol/L (ref 3.5–5.1)
Sodium: 140 mmol/L (ref 135–145)
TCO2: 23 mmol/L (ref 22–32)

## 2020-07-24 SURGERY — CYSTOSCOPY/URETEROSCOPY/HOLMIUM LASER/STENT PLACEMENT
Anesthesia: General | Site: Urethra | Laterality: Left

## 2020-07-24 MED ORDER — OXYCODONE-ACETAMINOPHEN 5-325 MG PO TABS: 1.0000 | ORAL_TABLET | ORAL | 0 refills | Status: AC | PRN

## 2020-07-24 MED ORDER — LIDOCAINE HCL (PF) 2 % IJ SOLN
INTRAMUSCULAR | Status: AC
  Filled 2020-07-24: qty 5

## 2020-07-24 MED ORDER — CEPHALEXIN 500 MG PO CAPS
500.0000 mg | ORAL_CAPSULE | Freq: Two times a day (BID) | ORAL | 0 refills | Status: AC
Start: 2020-07-24 — End: 2020-07-27

## 2020-07-24 MED ORDER — AMISULPRIDE (ANTIEMETIC) 5 MG/2ML IV SOLN: 10.0000 mg | Freq: Once | INTRAVENOUS | Status: DC | PRN

## 2020-07-24 MED ORDER — PROPOFOL 10 MG/ML IV BOLUS
INTRAVENOUS | Status: AC
  Filled 2020-07-24: qty 20

## 2020-07-24 MED ORDER — DEXAMETHASONE SODIUM PHOSPHATE 10 MG/ML IJ SOLN
INTRAMUSCULAR | Status: DC | PRN
  Administered 2020-07-24: 10 mg via INTRAVENOUS

## 2020-07-24 MED ORDER — ONDANSETRON HCL 4 MG/2ML IJ SOLN
INTRAMUSCULAR | Status: AC
  Filled 2020-07-24: qty 2

## 2020-07-24 MED ORDER — PROMETHAZINE HCL 25 MG/ML IJ SOLN: 6.2500 mg | INTRAMUSCULAR | Status: DC | PRN

## 2020-07-24 MED ORDER — CEFAZOLIN SODIUM-DEXTROSE 2-4 GM/100ML-% IV SOLN
INTRAVENOUS | Status: AC
  Filled 2020-07-24: qty 100

## 2020-07-24 MED ORDER — DOCUSATE SODIUM 100 MG PO CAPS: 100.0000 mg | ORAL_CAPSULE | Freq: Every day | ORAL | 0 refills | Status: AC | PRN

## 2020-07-24 MED ORDER — LIDOCAINE 2% (20 MG/ML) 5 ML SYRINGE
INTRAMUSCULAR | Status: DC | PRN
  Administered 2020-07-24: 80 mg via INTRAVENOUS

## 2020-07-24 MED ORDER — FENTANYL CITRATE (PF) 100 MCG/2ML IJ SOLN
INTRAMUSCULAR | Status: DC | PRN
  Administered 2020-07-24: 25 ug via INTRAVENOUS
  Administered 2020-07-24: 50 ug via INTRAVENOUS
  Administered 2020-07-24: 25 ug via INTRAVENOUS

## 2020-07-24 MED ORDER — FENTANYL CITRATE (PF) 100 MCG/2ML IJ SOLN
INTRAMUSCULAR | Status: AC
  Filled 2020-07-24: qty 2

## 2020-07-24 MED ORDER — DEXAMETHASONE SODIUM PHOSPHATE 10 MG/ML IJ SOLN
INTRAMUSCULAR | Status: AC
  Filled 2020-07-24: qty 1

## 2020-07-24 MED ORDER — CEFAZOLIN SODIUM-DEXTROSE 2-4 GM/100ML-% IV SOLN
2.0000 g | Freq: Once | INTRAVENOUS | Status: AC
  Administered 2020-07-24: 12:00:00 2 g via INTRAVENOUS

## 2020-07-24 MED ORDER — PROPOFOL 10 MG/ML IV BOLUS
INTRAVENOUS | Status: DC | PRN
  Administered 2020-07-24: 160 mg via INTRAVENOUS

## 2020-07-24 MED ORDER — MIDAZOLAM HCL 5 MG/5ML IJ SOLN
INTRAMUSCULAR | Status: DC | PRN
  Administered 2020-07-24: 2 mg via INTRAVENOUS

## 2020-07-24 MED ORDER — MIDAZOLAM HCL 2 MG/2ML IJ SOLN
INTRAMUSCULAR | Status: AC
  Filled 2020-07-24: qty 2

## 2020-07-24 MED ORDER — OXYCODONE HCL 5 MG/5ML PO SOLN: 5.0000 mg | Freq: Once | ORAL | Status: DC | PRN

## 2020-07-24 MED ORDER — OXYCODONE HCL 5 MG PO TABS: 5.0000 mg | ORAL_TABLET | Freq: Once | ORAL | Status: DC | PRN

## 2020-07-24 MED ORDER — FENTANYL CITRATE (PF) 100 MCG/2ML IJ SOLN: 25.0000 ug | INTRAMUSCULAR | Status: DC | PRN

## 2020-07-24 MED ORDER — ONDANSETRON HCL 4 MG/2ML IJ SOLN
INTRAMUSCULAR | Status: DC | PRN
  Administered 2020-07-24: 4 mg via INTRAVENOUS

## 2020-07-24 MED ORDER — IOHEXOL 300 MG/ML  SOLN
INTRAMUSCULAR | Status: DC | PRN
  Administered 2020-07-24: 12:00:00 10 mL via URETHRAL

## 2020-07-24 MED ORDER — LACTATED RINGERS IV SOLN: INTRAVENOUS | Status: DC

## 2020-07-24 SURGICAL SUPPLY — 26 items
BAG DRAIN URO-CYSTO SKYTR STRL (DRAIN) ×3 IMPLANT
BAG DRN UROCATH (DRAIN) ×1
BASKET LASER NITINOL 1.9FR (BASKET) ×2 IMPLANT
BSKT STON RTRVL 120 1.9FR (BASKET) ×1
CATH INTERMIT  6FR 70CM (CATHETERS) ×3 IMPLANT
CATH URET 5FR 28IN OPEN ENDED (CATHETERS) ×3 IMPLANT
CLOTH BEACON ORANGE TIMEOUT ST (SAFETY) ×3 IMPLANT
GLOVE BIO SURGEON STRL SZ7.5 (GLOVE) ×3 IMPLANT
GLOVE BIOGEL PI IND STRL 7.5 (GLOVE) IMPLANT
GLOVE BIOGEL PI INDICATOR 7.5 (GLOVE) ×4
GOWN STRL REUS W/TWL LRG LVL3 (GOWN DISPOSABLE) ×5 IMPLANT
GUIDEWIRE STR DUAL SENSOR (WIRE) ×3 IMPLANT
GUIDEWIRE ZIPWRE .038 STRAIGHT (WIRE) ×3 IMPLANT
IV NS IRRIG 3000ML ARTHROMATIC (IV SOLUTION) ×3 IMPLANT
KIT TURNOVER CYSTO (KITS) ×3 IMPLANT
MANIFOLD NEPTUNE II (INSTRUMENTS) ×3 IMPLANT
NS IRRIG 500ML POUR BTL (IV SOLUTION) ×3 IMPLANT
PACK CYSTO (CUSTOM PROCEDURE TRAY) ×3 IMPLANT
STENT URET 6FRX26 CONTOUR (STENTS) ×2 IMPLANT
SYR 10ML LL (SYRINGE) ×3 IMPLANT
TRACTIP FLEXIVA PULS ID 200XHI (Laser) ×1 IMPLANT
TRACTIP FLEXIVA PULSE ID 200 (Laser) ×3
TUBE CONNECTING 12'X1/4 (SUCTIONS) ×1
TUBE CONNECTING 12X1/4 (SUCTIONS) ×2 IMPLANT
TUBE FEEDING 8FR 16IN STR KANG (MISCELLANEOUS) ×3 IMPLANT
TUBING UROLOGY SET (TUBING) ×3 IMPLANT

## 2020-07-24 NOTE — Discharge Instructions (Signed)
Activity:  You are encouraged to ambulate frequently (about every hour during waking hours) to help prevent blood clots from forming in your legs or lungs.    Diet: You should advance your diet as instructed by your physician.  It will be normal to have some bloating, nausea, and abdominal discomfort intermittently.   Prescriptions:  You will be provided a prescription for pain medication to take as needed.  If your pain is not severe enough to require the prescription pain medication, you may take extra strength Tylenol instead which will have less side effects.  You should also take a prescribed stool softener to avoid straining with bowel movements as the prescription pain medication may constipate you.   What to call us about: You should call the office [phone]) if you develop fever > 101 or develop persistent vomiting. Activity:  You are encouraged to ambulate frequently (about every hour during waking hours) to help prevent blood clots from forming in your legs or lungs.  Remove stent Thursday, December 16th in the morning.  Post Anesthesia Home Care Instructions  Activity: Get plenty of rest for the remainder of the day. A responsible individual must stay with you for 24 hours following the procedure.  For the next 24 hours, DO NOT: -Drive a car -Paediatric nurse -Drink alcoholic beverages -Take any medication unless instructed by your physician -Make any legal decisions or sign important papers.  Meals: Start with liquid foods such as gelatin or soup. Progress to regular foods as tolerated. Avoid greasy, spicy, heavy foods. If nausea and/or vomiting occur, drink only clear liquids until the nausea and/or vomiting subsides. Call your physician if vomiting continues.  Special Instructions/Symptoms: Your throat may feel dry or sore from the anesthesia or the breathing tube placed in your throat during surgery. If this causes discomfort, gargle with warm salt water. The  discomfort should disappear within 24 hours.  Females should always wipe from front to back after elimination.  You may feel some burning pain when you urinate.  This should disappear with time.  Applying moist heat to the lower abdomen or a hot tub bath may help relieve the pain.    Ureteral Stent Implantation, Care After This sheet gives you information about how to care for yourself after your procedure. Your health care provider may also give you more specific instructions. If you have problems or questions, contact your health care provider. What can I expect after the procedure? After the procedure, it is common to have:  Nausea.  Mild pain when you urinate. You may feel this pain in your lower back or lower abdomen. The pain should stop within a few minutes after you urinate. This may last for up to 1 week.  A small amount of blood in your urine for several days. Follow these instructions at home: Medicines  Take over-the-counter and prescription medicines only as told by your health care provider.  If you were prescribed an antibiotic medicine, take it as told by your health care provider. Do not stop taking the antibiotic even if you start to feel better.  Do not drive for 24 hours if you were given a sedative during your procedure.  Ask your health care provider if the medicine prescribed to you requires you to avoid driving or using heavy machinery. Activity  Rest as told by your health care provider.  Avoid sitting for a long time without moving. Get up to take short walks every 1-2 hours. This is important to  improve blood flow and breathing. Ask for help if you feel weak or unsteady.  Return to your normal activities as told by your health care provider. Ask your health care provider what activities are safe for you. General instructions   Watch for any blood in your urine. Call your health care provider if the amount of blood in your urine increases.  If you have a  catheter: ? Follow instructions from your health care provider about taking care of your catheter and collection bag. ? Do not take baths, swim, or use a hot tub until your health care provider approves. Ask your health care provider if you may take showers. You may only be allowed to take sponge baths.  Drink enough fluid to keep your urine pale yellow.  Do not use any products that contain nicotine or tobacco, such as cigarettes, e-cigarettes, and chewing tobacco. These can delay healing after surgery. If you need help quitting, ask your health care provider.  Keep all follow-up visits as told by your health care provider. This is important. Contact a health care provider if:  You have pain that gets worse or does not get better with medicine, especially pain when you urinate.  You have difficulty urinating.  You feel nauseous or you vomit repeatedly during a period of more than 2 days after the procedure. Get help right away if:  Your urine is dark red or has blood clots in it.  You are leaking urine (have incontinence).  The end of the stent comes out of your urethra.  You cannot urinate.  You have sudden, sharp, or severe pain in your abdomen or lower back.  You have a fever.  You have swelling or pain in your legs.  You have difficulty breathing. Summary  After the procedure, it is common to have mild pain when you urinate that goes away within a few minutes after you urinate. This may last for up to 1 week.  Watch for any blood in your urine. Call your health care provider if the amount of blood in your urine increases.  Take over-the-counter and prescription medicines only as told by your health care provider.  Drink enough fluid to keep your urine pale yellow. This information is not intended to replace advice given to you by your health care provider. Make sure you discuss any questions you have with your health care provider. Document Revised: 05/05/2018 Document  Reviewed: 05/06/2018 Elsevier Patient Education  2020 [employer].

## 2020-07-24 NOTE — Anesthesia Preprocedure Evaluation (Addendum)
Anesthesia Evaluation  Patient identified by MRN, date of birth, ID band Patient awake    Reviewed: Allergy & Precautions, NPO status , Patient's Chart, lab work & pertinent test results  Airway Mallampati: II  TM Distance: >3 FB Neck ROM: Full    Dental no notable dental hx.    Pulmonary sleep apnea and Continuous Positive Airway Pressure Ventilation , former smoker,    Pulmonary exam normal breath sounds clear to auscultation       Cardiovascular Exercise Tolerance: Good hypertension, Normal cardiovascular exam Rhythm:Regular Rate:Normal     Neuro/Psych negative neurological ROS  negative psych ROS   GI/Hepatic negative GI ROS, Neg liver ROS,   Endo/Other  negative endocrine ROS  Renal/GU negative Renal ROS  negative genitourinary   Musculoskeletal negative musculoskeletal ROS (+)   Abdominal   Peds negative pediatric ROS (+)  Hematology negative hematology ROS (+)   Anesthesia Other Findings   Reproductive/Obstetrics negative OB ROS                             Anesthesia Physical Anesthesia Plan  ASA: II  Anesthesia Plan: General   Post-op Pain Management:    Induction: Intravenous  PONV Risk Score and Plan:   Airway Management Planned: LMA  Additional Equipment:   Intra-op Plan:   Post-operative Plan: Extubation in OR  Informed Consent: I have reviewed the patients History and Physical, chart, labs and discussed the procedure including the risks, benefits and alternatives for the proposed anesthesia with the patient or authorized representative who has indicated his/her understanding and acceptance.     Dental advisory given  Plan Discussed with: CRNA, Anesthesiologist and Surgeon  Anesthesia Plan Comments:         Anesthesia Quick Evaluation

## 2020-07-24 NOTE — H&P (Signed)
Heather Wilkerson  MRN: [id number]  DOB: 07-Sep-1955, 64 year old Female  SSN:    PRIMARY CARE:  Sela Hilding, MD  REFERRING:  Janith Lima, MD  PROVIDER:  Rexene Alberts, M.D.  LOCATION:  [facility] [phone]     --------------------------------------------------------------------------------   CC/HPI: Heather Wilkerson is a 64 year old female seen in followup with left ureteral and renal stone.   Since last visit, she has had some intermittent flank pain that is not that bothersome. Denies passing stone. UA with microheme today.   CT abdomen pelvis was obtained on 05/07/2020 that demonstrated a 4 mm obstructive calculus within the proximal left ureter with associated mild left hydronephrosis. She was additionally found to have a 6 mm nonobstructive left renal stone. She was also incidentally found to have a 1.7 cm left adrenal adenoma. Cortisol lab was normal with dexamethasone suppresion test. Still awaiting metanephrines and aldosterone/renin ratio.   She denies passing her stone. She denies dysuria or hematuria. She has had no increased urinary frequency or urgency. She states that her pain is well controlled presently. She states that this is her first episode of nephrolithiasis. She would like to proceed with scheduling L URS/LL.     ALLERGIES: None   MEDICATIONS: Atorvastatin Calcium 10 mg tablet  Calcium  Dexamethasone 1 mg tablet 1 tablet PO once Take at 11PM the day prior to AM cortisol lab draw  Fish Oil  Multivitamins  Vitamin D2     GU PSH: None   NON-GU PSH: None   GU PMH: Adrenal mass Unspec - 05/11/2020 Renal and ureteral calculus - 05/11/2020    NON-GU PMH: Hypercholesterolemia Sleep Apnea    FAMILY HISTORY: 1 Daughter - Runs in Family 1 son - Runs in Family diabet - Runs in Family Hypertension - Runs in Family   SOCIAL HISTORY: Marital Status: Married Preferred Language: English; Ethnicity: Not Hispanic Or Latino; Race:  Black or African American Current Smoking Status: Patient does not smoke anymore.   Tobacco Use Assessment Completed: Used Tobacco in last 30 days? Does not use drugs. Drinks 1 caffeinated drink per day. Has not had a blood transfusion.    REVIEW OF SYSTEMS:    GU Review Female:   Patient denies frequent urination, hard to postpone urination, burning /pain with urination, get up at night to urinate, leakage of urine, stream starts and stops, trouble starting your stream, have to strain to urinate, and being pregnant.  Gastrointestinal (Upper):   Patient denies nausea, vomiting, and indigestion/ heartburn.  Gastrointestinal (Lower):   Patient denies diarrhea and constipation.  Constitutional:   Patient denies fever, night sweats, weight loss, and fatigue.  Skin:   Patient denies skin rash/ lesion and itching.  Eyes:   Patient denies blurred vision and double vision.  Ears/ Nose/ Throat:   Patient denies sore throat and sinus problems.  Hematologic/Lymphatic:   Patient denies swollen glands and easy bruising.  Cardiovascular:   Patient denies leg swelling and chest pains.  Respiratory:   Patient denies shortness of breath and cough.  Endocrine:   Patient denies excessive thirst.  Musculoskeletal:   Patient denies back pain and joint pain.  Neurological:   Patient denies headaches and dizziness.  Psychologic:   Patient denies depression and anxiety.   VITAL SIGNS:      06/14/2020 12:00 PM  Weight 170 lb / 77.11 kg  Height 64 in / 162.56 cm  BP 159/78 mmHg  Pulse 71 /min  Temperature 96.9  F / 36.0 C  BMI 29.2 kg/m   MULTI-SYSTEM PHYSICAL EXAMINATION:    Constitutional: Well-nourished. No physical deformities. Normally developed. Good grooming.  Respiratory: No labored breathing, no use of accessory muscles.   Cardiovascular: Normal temperature, normal extremity pulses, no swelling, no varicosities.  Gastrointestinal: No mass, no tenderness, no rigidity, non obese abdomen. No CVA  tenderness.     Complexity of Data:  Source Of History:  Patient, Medical Record Summary  Urine Test Review:   Urinalysis  X-Ray Review: C.T. Abdomen: Reviewed Films. Reviewed Report.    Notes:                     CLINICAL DATA: Initial evaluation for acute flank pain, stone  disease suspected.     EXAM:  CT ABDOMEN AND PELVIS WITHOUT CONTRAST     TECHNIQUE:  Multidetector CT imaging of the abdomen and pelvis was performed  following the standard protocol without IV contrast.     COMPARISON: None.     FINDINGS:  Lower chest: Visualized lung bases are clear.     Hepatobiliary: Liver demonstrates a normal unenhanced appearance.  Gallbladder within normal limits. No biliary dilatation.     Pancreas: Pancreas within normal limits.     Spleen: Spleen mildly lobulated but otherwise unremarkable and  within normal limits.     Adrenals/Urinary Tract: 1.7 cm hypodense left adrenal nodule, most  consistent with a benign adenoma. Adrenal glands otherwise  unremarkable.     Right kidney unremarkable without nephrolithiasis or hydronephrosis.  No radiopaque calculi seen along the course of the right renal  collecting system. No right-sided hydroureter.     On the left there is an obstructive 4 mm calculus within the  proximal left ureter just beyond the left UPJ (series 3, image 34).  Associated mild left hydronephrosis and perinephric fat stranding.  No other radiopaque calculi seen distally along the course of the  left ureter. Additional 6 mm nonobstructive stone present at the  upper pole.     Partially distended bladder within normal limits. No layering stones  within the bladder lumen.     Stomach/Bowel: Stomach within normal limits. No evidence for bowel  obstruction. Normal appendix. No acute inflammatory changes seen  about the bowels.     Vascular/Lymphatic: Mild to moderate aorto bi-iliac atherosclerotic  disease. No aneurysm. No adenopathy.     Reproductive:  Uterus and ovaries within normal limits.     Other: No free air or fluid.     Musculoskeletal: No acute osseous abnormality. No discrete or  worrisome osseous lesions. Grade 1 anterolisthesis of L4 on L5,  chronic and facet mediated.     IMPRESSION:  1. 4 mm obstructive calculus within the proximal left ureter with  associated mild left hydronephrosis.  2. Additional 6 mm nonobstructive left renal nephrolithiasis.  3. No other acute intra-abdominal or pelvic process.  4. 1.7 cm left adrenal adenoma.     Aortic Atherosclerosis (ICD10-I70.0).      PROCEDURES:          Urinalysis w/Scope Dipstick Dipstick Cont'd Micro  Color: Yellow Bilirubin: Neg mg/dL WBC/hpf: NS (Not Seen)  Appearance: Clear Ketones: Neg mg/dL RBC/hpf: 20 - 40/hpf  Specific Gravity: 1.025 Blood: 3+ ery/uL Bacteria: NS (Not Seen)  pH: 6.0 Protein: Neg mg/dL Cystals: NS (Not Seen)  Glucose: Neg mg/dL Urobilinogen: 0.2 mg/dL Casts: NS (Not Seen)    Nitrites: Neg Trichomonas: Not Present    Leukocyte Esterase: Neg leu/uL Mucous:  Not Present      Epithelial Cells: NS (Not Seen)      Yeast: NS (Not Seen)      Sperm: Not Present    Notes: unspun microscopic performed    ASSESSMENT:      ICD-10 Details  1 GU:   Renal and ureteral calculus - N20.2   2   Adrenal mass Unspec - D35.00    PLAN:           Orders Labs Metanephrines, Aldosterone/Renin Ratio, CULTURE, URINE          Document Letter(s):  Created for Patient: Clinical Summary         Notes:   1. Urolithiasis: 4 mm left proximal ureteral stone with mild hydronephrosis on CT A/P 05/07/2020. Additional 6 mm nonobstructive left renal stone. Urinalysis today negative for infection. Microheme present. Has not passed stone yet. Will proceed with cysto, L URS/LL, L stent placement. Risks and benefits discussed. Send urine for culture.   2. Left adrenal adenoma: 1.7 cm left adrenal adenoma seen on CT A/P 05/07/2020. Discussed with patient it is a benign  lesion however we need to pursue metabolic work-up. Ordered adrenal labs including plasma metanephrines, aldosterone to renin ratio and cortisol. Dexamethasone suppresion test normal, normal cortisol.   CC: Sela Hilding, MD    Signed by Rexene Alberts, M.D. on 06/14/20 at 12:27 PM (EDT)  Urology Preoperative H&P   Chief Complaint: Left ureteral and renal stone  History of Present Illness: Heather Wilkerson is a 64 y.o. female with a left ureteral and renal stone here for URS/LL, left stent placement. Denies fevers or chills. Had neg UCx.    Past Medical History:  Diagnosis Date  . BV (bacterial vaginosis) 08/27/01  . 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 Family history of adverse reaction to anesthesia    brother slow to awake one time yrs ago  . H/O hemorrhoids 2006 & 2012  . History of being obese 01/19/10  . History of kidney stones   . Hypertension   . Sleep apnea    uses cpap set on 6  . Trichimoniasis 01/24/97   On PAP    Past Surgical History:  Procedure Laterality Date  . laser  conization of cervix  1989, 01/29/1991    Allergies: No Known Allergies  History reviewed. No pertinent family history.  Social History:  reports that she quit smoking about 24 years ago. Her smoking use included cigarettes. She has a 10.00 pack-year smoking history. She has never used smokeless tobacco. She reports that she does not drink alcohol and does not use drugs.  ROS: A complete review of systems was performed.  All systems are negative except for pertinent findings as noted.  Physical Exam:  Vital signs in last 24 hours: Temp:  [98.3 F (36.8 C)] 98.3 F (36.8 C) (12/13 1020) Pulse Rate:  [67] 67 (12/13 1020) Resp:  [15] 15 (12/13 1020) BP: (143)/(80) 143/80 (12/13 1020) SpO2:  [100 %] 100 % (12/13 1020) Weight:  [76.3 kg] 76.3 kg (12/13 1020) Constitutional:  Alert and oriented, No acute distress Cardiovascular: Regular rate and  rhythm Respiratory: Normal respiratory effort, Lungs clear bilaterally GI: Abdomen is soft, nontender, nondistended, no abdominal masses GU: No CVA tenderness Lymphatic: No lymphadenopathy Neurologic: Grossly intact, no focal deficits Psychiatric: Normal mood and affect  Laboratory Data:  Recent Labs    07/24/20 1100  HGB 12.9  HCT 38.0    Recent Labs  07/24/20 1100  NA 140  K 3.9  CL 107  GLUCOSE 96  BUN 16  CREATININE 0.60     Results for orders placed or performed during the hospital encounter of 07/24/20 (from the past 24 hour(s))  I-STAT, chem 8     Status: None   Collection Time: 07/24/20 11:00 AM  Result Value Ref Range   Sodium 140 135 - 145 mmol/L   Potassium 3.9 3.5 - 5.1 mmol/L   Chloride 107 98 - 111 mmol/L   BUN 16 8 - 23 mg/dL   Creatinine, Ser 0.60 0.44 - 1.00 mg/dL   Glucose, Bld 96 70 - 99 mg/dL   Calcium, Ion 1.20 1.15 - 1.40 mmol/L   TCO2 23 22 - 32 mmol/L   Hemoglobin 12.9 12.0 - 15.0 g/dL   HCT 38.0 36.0 - 46.0 %   Recent Results (from the past 240 hour(s))  SARS CORONAVIRUS 2 (TAT 6-24 HRS) Nasopharyngeal Nasopharyngeal Swab     Status: None   Collection Time: 07/20/20  9:53 AM   Specimen: Nasopharyngeal Swab  Result Value Ref Range Status   SARS Coronavirus 2 NEGATIVE NEGATIVE Final    Comment: (NOTE) SARS-CoV-2 target nucleic acids are NOT DETECTED.  The SARS-CoV-2 RNA is generally detectable in upper and lower respiratory specimens during the acute phase of infection. Negative results do not preclude SARS-CoV-2 infection, do not rule out co-infections with other pathogens, and should not be used as the sole basis for treatment or other patient management decisions. Negative results must be combined with clinical observations, patient history, and epidemiological information. The expected result is Negative.  Fact Sheet for Patients: SugarRoll.be  Fact Sheet for Healthcare  Providers: https://www.woods-mathews.com/  This test is not yet approved or cleared by the [place] FDA and  has been authorized for detection and/or diagnosis of SARS-CoV-2 by FDA under an Emergency Use Authorization (EUA). This EUA will remain  in effect (meaning this test can be used) for the duration of the COVID-19 declaration under Se ction 564(b)(1) of the Act, 21 U.S.C. section 360bbb-3(b)(1), unless the authorization is terminated or revoked sooner.  Performed at [facility], [place] [address], [place], [place] [address]     Renal Function: Recent Labs    07/24/20 1100  CREATININE 0.60   Estimated Creatinine Clearance: 69.4 mL/min (by C-G formula based on SCr of 0.6 mg/dL).  Radiologic Imaging: No results found.  I independently reviewed the above imaging studies.  Assessment and Plan MAHOGONY GILCHREST is a 64 y.o. female with a left ureteral and renal stone here for URS/LL, left stent placement. Denies fevers or chills. No dysuria. Had neg UCx.    Matt R. Aoife Bold MD 07/24/2020, 11:16 AM  [facility] Pager: [phone]

## 2020-07-24 NOTE — Anesthesia Procedure Notes (Signed)
Procedure Name: LMA Insertion Date/Time: 07/24/2020 11:48 AM Performed by: Maxyne Derocher D, CRNA Pre-anesthesia Checklist: Patient identified, Emergency Drugs available, Suction available and Patient being monitored Patient Re-evaluated:Patient Re-evaluated prior to induction Oxygen Delivery Method: Circle system utilized Preoxygenation: Pre-oxygenation with 100% oxygen Induction Type: IV induction Ventilation: Mask ventilation without difficulty LMA: LMA inserted LMA Size: 4.0 Tube type: Oral Number of attempts: 1 Placement Confirmation: positive ETCO2 and breath sounds checked- equal and bilateral Tube secured with: Tape Dental Injury: Teeth and Oropharynx as per pre-operative assessment

## 2020-07-24 NOTE — Op Note (Signed)
Operative Note  Preoperative diagnosis:  1.  Left ureteral and renal stone  Postoperative diagnosis: 1.  Left ureteral and renal stone  Procedure(s): 1.  Cystoscopy 2.  Left retrograde pyelogram 3.  Left ureteroscopy with laser lithotripsy and basket extraction of stone 4.  Left ureteral stent placement 5.  Fluoroscopy less than 1 hour with Intra-Op interpretation  Surgeon: Rexene Alberts, MD  Assistants:  None  Anesthesia:  General  Complications:  None  EBL:  minimal  Specimens: 1. Stones for stone analysis  Drains/Catheters: 1.  6Fr x26cm left ureteral stent  Intraoperative findings:   1. Cystoscopy demonstrated no bladder lesions 2. Left retrograde pyelogram demonstrated no filling defects.  There is no hydronephrosis 3. Left ureteroscopy identified the ureteral stone which was then fragmented and basket extracted and 2 small pieces.  We also identified a 6 mm stone in the left upper pole.  This was also fragmented and basket extracted. 4. No filling defect at the end of the case.  Successful left ureteral stent placement with attached tether string  Indication:  Heather Wilkerson is a 64 y.o. female with a 4 mm left proximal ureteral stone associate with an additional 6 mm left renal pelvis stone.  She had persistent pain refractory to medical expulsive therapy.  She presents today for definitive treatment of her urolithiasis.  Description of procedure: After informed consent was obtained from the patient, the patient was identified and taken to the operating room and placed in the supine position.  General anesthesia was administered as well as perioperative IV antibiotics.  At the beginning of the case, a time-out was performed to properly identify the patient, the surgery to be performed, and the surgical site.  Sequential compression devices were applied to the lower extremities at the beginning of the case for DVT prophylaxis.  The patient was then placed in the  dorsal lithotomy supine position, prepped and draped in sterile fashion.  Preliminary scout fluoroscopy revealed that there was a 82mm calcification area at the left mid ureter, which corresponds to the stone found on the preoperative CT scan. We then passed the 21-French rigid cystoscope through the urethra and into the bladder under vision without any difficulty.  A systematic evaluation of the bladder revealed no evidence of any suspicious bladder lesions.  Ureteral orifices were in normal position.    Under cystoscopic and flouroscopic guidance, we cannulated the left ureteral orifice with a 5-French open-ended ureteral catheter and a gentle retrograde pyelogram was performed, revealing a normal caliber ureter without any filling defects. There was no hydronephrosis of the collecting system. A 0.038 sensor wire was then passed up to the level of the renal pelvis and secured to the drape as a safety wire. The ureteral catheter and cystoscope were removed, leaving the safety wire in place.   A semi-rigid ureteroscope was passed alongside the wire up the distal ureter which appeared normal.  We did encounter a 4 mm stone within the left mid ureter.  We fragmented this into 2 small pieces and these were best extracted.  There were no further stones along the course of the ureter. A second 0.038 sensor wire was passed under direct vision and the semirigid scope was removed. The flexible ureteroscope was advanced into the collecting system. The collecting system was inspected. The calculus was identified at the left upper pole. Using the 200 micron holmium laser fiber, the left upper pole stone was fragmented completely. A 2.2 Fr zero tip basket was used to  remove the fragments under visual guidance. These were sent for chemical analysis. With the ureteroscope in the kidney, a gentle pyelogram was performed to delineate the calyceal system and we evaluated the calyces systematically. We encountered no further  stone burden. The rest of the stone fragments were very tiny and these were  irrigated away gently. The calyces were re-inspected and there were no significant stone fragment residual.   We then withdrew the ureteroscope back down the ureter,noting no evidence of any stones along the course of the ureter.  Prior to removing the ureteroscope, we did pass the Glidewire back up to the ureter to the renal pelvis.  Once the ureteroscope was removed, we then used the Glidewire under fluoroscopic guidance and passed up a 6-French x 26 cm double-pigtail ureteral stent up the ureter, making sure that the proximal and distal ends coiled within the kidney and bladder respectively.  Note that we left a long tether string attached to the distal end of the ureteral stent and it exited the urethral meatus and was secured in the vagina.  The cystoscope was then advanced back into the bladder under vision.  We were able to see the distal stent coiling nicely within the bladder.  The bladder was then emptied with irrigation solution.  The cystoscope was then removed.    The patient tolerated the procedure well and there was no complication. Patient was awoken from anesthesia and taken to the recovery room in stable condition. I was present and scrubbed for the entirety of the case.  Plan:  Patient will be discharged home.  She will remove her stent in 3 days.  She will follow-up as scheduled 6 weeks for renal ultrasound.  [facility]  Pager: [phone]

## 2020-07-24 NOTE — Transfer of Care (Signed)
Immediate Anesthesia Transfer of Care Note  Patient: Heather Wilkerson  Procedure(s) Performed: CYSTOSCOPY/RETROGRADE/URETEROSCOPY/HOLMIUM LASER/STENT PLACEMENT (Left Urethra)  Patient Location: PACU  Anesthesia Type:General  Level of Consciousness: awake, alert  and oriented  Airway & Oxygen Therapy: Patient Spontanous Breathing and Patient connected to nasal cannula oxygen  Post-op Assessment: Report given to RN and Post -op Vital signs reviewed and stable  Post vital signs: Reviewed and stable  Last Vitals:  Vitals Value Taken Time  BP 145/87 07/24/20 1253  Temp    Pulse 94 07/24/20 1254  Resp 20 07/24/20 1254  SpO2 96 % 07/24/20 1254  Vitals shown include unvalidated device data.  Last Pain:  Vitals:   07/24/20 1020  TempSrc: Oral  PainSc: 0-No pain      Patients Stated Pain Goal: 8 (47/39/[id number])  Complications: No complications documented.

## 2020-07-25 ENCOUNTER — Encounter (HOSPITAL_BASED_OUTPATIENT_CLINIC_OR_DEPARTMENT_OTHER): Payer: Self-pay | Admitting: Urology

## 2020-07-25 DIAGNOSIS — N202 Calculus of kidney with calculus of ureter: Secondary | ICD-10-CM | POA: Diagnosis not present

## 2020-07-25 NOTE — Anesthesia Postprocedure Evaluation (Signed)
Anesthesia Post Note  Patient: Heather Wilkerson  Procedure(s) Performed: CYSTOSCOPY/RETROGRADE/URETEROSCOPY/HOLMIUM LASER/STENT PLACEMENT (Left Urethra)     Patient location during evaluation: PACU Anesthesia Type: General Level of consciousness: awake and alert Pain management: pain level controlled Vital Signs Assessment: post-procedure vital signs reviewed and stable Respiratory status: spontaneous breathing, nonlabored ventilation and respiratory function stable Cardiovascular status: blood pressure returned to baseline and stable Postop Assessment: no apparent nausea or vomiting Anesthetic complications: no   No complications documented.  Last Vitals:  Vitals:   07/24/20 1330 07/24/20 1415  BP: (!) 148/87 (!) 151/78  Pulse: 80 73  Resp: 15 15  Temp: 36.7 C 36.8 C  SpO2: 95% 98%    Last Pain:  Vitals:   07/24/20 1415  TempSrc:   PainSc: 0-No pain                 Merlinda Frederick

## 2020-08-08 DIAGNOSIS — H43393 Other vitreous opacities, bilateral: Secondary | ICD-10-CM | POA: Diagnosis not present

## 2020-09-14 DIAGNOSIS — Z0000 Encounter for general adult medical examination without abnormal findings: Secondary | ICD-10-CM | POA: Diagnosis not present

## 2020-09-14 DIAGNOSIS — R42 Dizziness and giddiness: Secondary | ICD-10-CM | POA: Diagnosis not present

## 2020-09-14 DIAGNOSIS — I10 Essential (primary) hypertension: Secondary | ICD-10-CM | POA: Diagnosis not present

## 2020-09-14 DIAGNOSIS — E559 Vitamin D deficiency, unspecified: Secondary | ICD-10-CM | POA: Diagnosis not present

## 2020-09-14 DIAGNOSIS — M8580 Other specified disorders of bone density and structure, unspecified site: Secondary | ICD-10-CM | POA: Diagnosis not present

## 2020-09-14 DIAGNOSIS — G4733 Obstructive sleep apnea (adult) (pediatric): Secondary | ICD-10-CM | POA: Diagnosis not present

## 2020-09-14 DIAGNOSIS — Z23 Encounter for immunization: Secondary | ICD-10-CM | POA: Diagnosis not present

## 2020-09-14 DIAGNOSIS — E7800 Pure hypercholesterolemia, unspecified: Secondary | ICD-10-CM | POA: Diagnosis not present

## 2020-10-03 DIAGNOSIS — D3500 Benign neoplasm of unspecified adrenal gland: Secondary | ICD-10-CM | POA: Diagnosis not present

## 2020-10-03 DIAGNOSIS — N202 Calculus of kidney with calculus of ureter: Secondary | ICD-10-CM | POA: Diagnosis not present

## 2021-03-15 DIAGNOSIS — I700 Atherosclerosis of aorta: Secondary | ICD-10-CM | POA: Diagnosis not present

## 2021-03-15 DIAGNOSIS — N200 Calculus of kidney: Secondary | ICD-10-CM | POA: Diagnosis not present

## 2021-03-15 DIAGNOSIS — E7800 Pure hypercholesterolemia, unspecified: Secondary | ICD-10-CM | POA: Diagnosis not present

## 2021-03-15 DIAGNOSIS — E279 Disorder of adrenal gland, unspecified: Secondary | ICD-10-CM | POA: Diagnosis not present

## 2021-03-15 DIAGNOSIS — M8580 Other specified disorders of bone density and structure, unspecified site: Secondary | ICD-10-CM | POA: Diagnosis not present

## 2021-03-15 DIAGNOSIS — I10 Essential (primary) hypertension: Secondary | ICD-10-CM | POA: Diagnosis not present

## 2021-05-08 DIAGNOSIS — Z23 Encounter for immunization: Secondary | ICD-10-CM | POA: Diagnosis not present

## 2021-05-22 DIAGNOSIS — E785 Hyperlipidemia, unspecified: Secondary | ICD-10-CM | POA: Diagnosis not present

## 2021-05-22 DIAGNOSIS — I10 Essential (primary) hypertension: Secondary | ICD-10-CM | POA: Diagnosis not present

## 2021-05-22 DIAGNOSIS — G4733 Obstructive sleep apnea (adult) (pediatric): Secondary | ICD-10-CM | POA: Diagnosis not present

## 2021-05-22 DIAGNOSIS — Z87891 Personal history of nicotine dependence: Secondary | ICD-10-CM | POA: Diagnosis not present

## 2021-05-22 DIAGNOSIS — M8580 Other specified disorders of bone density and structure, unspecified site: Secondary | ICD-10-CM | POA: Diagnosis not present

## 2021-05-22 DIAGNOSIS — Z9981 Dependence on supplemental oxygen: Secondary | ICD-10-CM | POA: Diagnosis not present

## 2021-07-02 ENCOUNTER — Other Ambulatory Visit: Payer: Self-pay | Admitting: Family Medicine

## 2021-07-02 DIAGNOSIS — Z1231 Encounter for screening mammogram for malignant neoplasm of breast: Secondary | ICD-10-CM

## 2021-08-07 ENCOUNTER — Ambulatory Visit
Admission: RE | Admit: 2021-08-07 | Discharge: 2021-08-07 | Disposition: A | Discharge status: Home / Self Care | Payer: Medicare PPO | Source: Ambulatory Visit | Attending: Family Medicine | Admitting: Family Medicine

## 2021-08-07 ENCOUNTER — Other Ambulatory Visit: Payer: Self-pay

## 2021-08-07 DIAGNOSIS — Z1231 Encounter for screening mammogram for malignant neoplasm of breast: Secondary | ICD-10-CM | POA: Diagnosis not present

## 2021-09-06 DIAGNOSIS — H43393 Other vitreous opacities, bilateral: Secondary | ICD-10-CM | POA: Diagnosis not present

## 2021-09-21 DIAGNOSIS — E559 Vitamin D deficiency, unspecified: Secondary | ICD-10-CM | POA: Diagnosis not present

## 2021-09-21 DIAGNOSIS — G4733 Obstructive sleep apnea (adult) (pediatric): Secondary | ICD-10-CM | POA: Diagnosis not present

## 2021-09-21 DIAGNOSIS — I10 Essential (primary) hypertension: Secondary | ICD-10-CM | POA: Diagnosis not present

## 2021-09-21 DIAGNOSIS — Z0000 Encounter for general adult medical examination without abnormal findings: Secondary | ICD-10-CM | POA: Diagnosis not present

## 2021-09-21 DIAGNOSIS — E7800 Pure hypercholesterolemia, unspecified: Secondary | ICD-10-CM | POA: Diagnosis not present

## 2021-09-21 DIAGNOSIS — M8580 Other specified disorders of bone density and structure, unspecified site: Secondary | ICD-10-CM | POA: Diagnosis not present

## 2021-09-21 DIAGNOSIS — Z23 Encounter for immunization: Secondary | ICD-10-CM | POA: Diagnosis not present

## 2021-10-02 ENCOUNTER — Other Ambulatory Visit: Payer: Self-pay | Admitting: Family Medicine

## 2021-10-02 DIAGNOSIS — E2839 Other primary ovarian failure: Secondary | ICD-10-CM

## 2022-03-01 ENCOUNTER — Ambulatory Visit
Admission: RE | Admit: 2022-03-01 | Discharge: 2022-03-01 | Disposition: A | Discharge status: Home / Self Care | Payer: Medicare PPO | Source: Ambulatory Visit | Attending: Family Medicine | Admitting: Family Medicine

## 2022-03-01 DIAGNOSIS — Z780 Asymptomatic menopausal state: Secondary | ICD-10-CM | POA: Diagnosis not present

## 2022-03-01 DIAGNOSIS — M8589 Other specified disorders of bone density and structure, multiple sites: Secondary | ICD-10-CM | POA: Diagnosis not present

## 2022-03-01 DIAGNOSIS — E2839 Other primary ovarian failure: Secondary | ICD-10-CM

## 2022-03-19 DIAGNOSIS — I10 Essential (primary) hypertension: Secondary | ICD-10-CM | POA: Diagnosis not present

## 2022-03-19 DIAGNOSIS — Z8249 Family history of ischemic heart disease and other diseases of the circulatory system: Secondary | ICD-10-CM | POA: Diagnosis not present

## 2022-03-19 DIAGNOSIS — Z823 Family history of stroke: Secondary | ICD-10-CM | POA: Diagnosis not present

## 2022-03-19 DIAGNOSIS — Z803 Family history of malignant neoplasm of breast: Secondary | ICD-10-CM | POA: Diagnosis not present

## 2022-03-19 DIAGNOSIS — Z87891 Personal history of nicotine dependence: Secondary | ICD-10-CM | POA: Diagnosis not present

## 2022-03-19 DIAGNOSIS — E785 Hyperlipidemia, unspecified: Secondary | ICD-10-CM | POA: Diagnosis not present

## 2022-04-18 DIAGNOSIS — E559 Vitamin D deficiency, unspecified: Secondary | ICD-10-CM | POA: Diagnosis not present

## 2022-05-17 DIAGNOSIS — Z23 Encounter for immunization: Secondary | ICD-10-CM | POA: Diagnosis not present

## 2022-07-10 ENCOUNTER — Other Ambulatory Visit: Payer: Self-pay | Admitting: Family Medicine

## 2022-07-10 DIAGNOSIS — Z1231 Encounter for screening mammogram for malignant neoplasm of breast: Secondary | ICD-10-CM

## 2022-07-23 DIAGNOSIS — M8580 Other specified disorders of bone density and structure, unspecified site: Secondary | ICD-10-CM | POA: Diagnosis not present

## 2022-07-23 DIAGNOSIS — R87610 Atypical squamous cells of undetermined significance on cytologic smear of cervix (ASC-US): Secondary | ICD-10-CM | POA: Diagnosis not present

## 2022-07-23 DIAGNOSIS — Z01411 Encounter for gynecological examination (general) (routine) with abnormal findings: Secondary | ICD-10-CM | POA: Diagnosis not present

## 2022-07-23 DIAGNOSIS — Z6829 Body mass index (BMI) 29.0-29.9, adult: Secondary | ICD-10-CM | POA: Diagnosis not present

## 2022-07-23 DIAGNOSIS — Z01419 Encounter for gynecological examination (general) (routine) without abnormal findings: Secondary | ICD-10-CM | POA: Diagnosis not present

## 2022-07-23 DIAGNOSIS — K649 Unspecified hemorrhoids: Secondary | ICD-10-CM | POA: Diagnosis not present

## 2022-07-23 DIAGNOSIS — Z1151 Encounter for screening for human papillomavirus (HPV): Secondary | ICD-10-CM | POA: Diagnosis not present

## 2022-09-04 ENCOUNTER — Ambulatory Visit
Admission: RE | Admit: 2022-09-04 | Discharge: 2022-09-04 | Disposition: A | Discharge status: Home / Self Care | Payer: Medicare PPO | Source: Ambulatory Visit | Attending: Family Medicine | Admitting: Family Medicine

## 2022-09-04 DIAGNOSIS — Z1231 Encounter for screening mammogram for malignant neoplasm of breast: Secondary | ICD-10-CM | POA: Diagnosis not present

## 2022-10-01 DIAGNOSIS — I700 Atherosclerosis of aorta: Secondary | ICD-10-CM | POA: Diagnosis not present

## 2022-10-01 DIAGNOSIS — Z0000 Encounter for general adult medical examination without abnormal findings: Secondary | ICD-10-CM | POA: Diagnosis not present

## 2022-10-01 DIAGNOSIS — E7800 Pure hypercholesterolemia, unspecified: Secondary | ICD-10-CM | POA: Diagnosis not present

## 2022-10-01 DIAGNOSIS — M8580 Other specified disorders of bone density and structure, unspecified site: Secondary | ICD-10-CM | POA: Diagnosis not present

## 2022-10-01 DIAGNOSIS — I10 Essential (primary) hypertension: Secondary | ICD-10-CM | POA: Diagnosis not present

## 2022-10-01 DIAGNOSIS — E559 Vitamin D deficiency, unspecified: Secondary | ICD-10-CM | POA: Diagnosis not present

## 2022-10-01 DIAGNOSIS — G4733 Obstructive sleep apnea (adult) (pediatric): Secondary | ICD-10-CM | POA: Diagnosis not present

## 2022-10-01 DIAGNOSIS — E279 Disorder of adrenal gland, unspecified: Secondary | ICD-10-CM | POA: Diagnosis not present

## 2022-10-15 DIAGNOSIS — H43393 Other vitreous opacities, bilateral: Secondary | ICD-10-CM | POA: Diagnosis not present

## 2023-01-23 DIAGNOSIS — M8580 Other specified disorders of bone density and structure, unspecified site: Secondary | ICD-10-CM | POA: Diagnosis not present

## 2023-01-23 DIAGNOSIS — E785 Hyperlipidemia, unspecified: Secondary | ICD-10-CM | POA: Diagnosis not present

## 2023-01-23 DIAGNOSIS — G4730 Sleep apnea, unspecified: Secondary | ICD-10-CM | POA: Diagnosis not present

## 2023-01-23 DIAGNOSIS — I700 Atherosclerosis of aorta: Secondary | ICD-10-CM | POA: Diagnosis not present

## 2023-01-23 DIAGNOSIS — I10 Essential (primary) hypertension: Secondary | ICD-10-CM | POA: Diagnosis not present

## 2023-01-23 DIAGNOSIS — Z87891 Personal history of nicotine dependence: Secondary | ICD-10-CM | POA: Diagnosis not present

## 2023-08-26 ENCOUNTER — Other Ambulatory Visit: Payer: Self-pay | Admitting: Family Medicine

## 2023-08-26 DIAGNOSIS — Z1231 Encounter for screening mammogram for malignant neoplasm of breast: Secondary | ICD-10-CM

## 2023-09-10 ENCOUNTER — Ambulatory Visit
Admission: RE | Admit: 2023-09-10 | Discharge: 2023-09-10 | Disposition: A | Discharge status: Home / Self Care | Payer: Medicare PPO | Source: Ambulatory Visit | Attending: Family Medicine | Admitting: Family Medicine

## 2023-09-10 DIAGNOSIS — Z1231 Encounter for screening mammogram for malignant neoplasm of breast: Secondary | ICD-10-CM | POA: Diagnosis not present

## 2023-10-06 DIAGNOSIS — M8580 Other specified disorders of bone density and structure, unspecified site: Secondary | ICD-10-CM | POA: Diagnosis not present

## 2023-10-06 DIAGNOSIS — E7800 Pure hypercholesterolemia, unspecified: Secondary | ICD-10-CM | POA: Diagnosis not present

## 2023-10-06 DIAGNOSIS — Z0000 Encounter for general adult medical examination without abnormal findings: Secondary | ICD-10-CM | POA: Diagnosis not present

## 2023-10-06 DIAGNOSIS — I10 Essential (primary) hypertension: Secondary | ICD-10-CM | POA: Diagnosis not present

## 2023-10-06 DIAGNOSIS — G4733 Obstructive sleep apnea (adult) (pediatric): Secondary | ICD-10-CM | POA: Diagnosis not present

## 2023-10-21 DIAGNOSIS — H43393 Other vitreous opacities, bilateral: Secondary | ICD-10-CM | POA: Diagnosis not present

## 2023-11-07 DIAGNOSIS — E663 Overweight: Secondary | ICD-10-CM | POA: Diagnosis not present

## 2023-11-07 DIAGNOSIS — M8580 Other specified disorders of bone density and structure, unspecified site: Secondary | ICD-10-CM | POA: Diagnosis not present

## 2023-11-07 DIAGNOSIS — I10 Essential (primary) hypertension: Secondary | ICD-10-CM | POA: Diagnosis not present

## 2023-11-07 DIAGNOSIS — Z8249 Family history of ischemic heart disease and other diseases of the circulatory system: Secondary | ICD-10-CM | POA: Diagnosis not present

## 2023-11-07 DIAGNOSIS — K056 Periodontal disease, unspecified: Secondary | ICD-10-CM | POA: Diagnosis not present

## 2023-11-07 DIAGNOSIS — G4733 Obstructive sleep apnea (adult) (pediatric): Secondary | ICD-10-CM | POA: Diagnosis not present

## 2023-11-07 DIAGNOSIS — Z87891 Personal history of nicotine dependence: Secondary | ICD-10-CM | POA: Diagnosis not present

## 2023-11-07 DIAGNOSIS — E785 Hyperlipidemia, unspecified: Secondary | ICD-10-CM | POA: Diagnosis not present

## 2023-11-07 DIAGNOSIS — Z833 Family history of diabetes mellitus: Secondary | ICD-10-CM | POA: Diagnosis not present

## 2024-09-17 ENCOUNTER — Other Ambulatory Visit: Payer: Self-pay | Admitting: Family Medicine

## 2024-09-17 DIAGNOSIS — Z1231 Encounter for screening mammogram for malignant neoplasm of breast: Secondary | ICD-10-CM

## 2024-09-29 ENCOUNTER — Ambulatory Visit
